# Patient Record
Sex: Female | Born: 1985 | Race: White | Hispanic: No | Marital: Married | Smoking: Former smoker
Health system: Southern US, Community
[De-identification: ages and names within clinical notes are randomized; demographics above are authoritative.]

## PROBLEM LIST (undated history)

## (undated) ENCOUNTER — Inpatient Hospital Stay (HOSPITAL_COMMUNITY): Payer: Self-pay

## (undated) DIAGNOSIS — I10 Essential (primary) hypertension: Secondary | ICD-10-CM

## (undated) HISTORY — PX: WISDOM TOOTH EXTRACTION: SHX21

## (undated) HISTORY — DX: Essential (primary) hypertension: I10

---
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
## 2007-01-25 ENCOUNTER — Ambulatory Visit (HOSPITAL_COMMUNITY): Admission: RE | Admit: 2007-01-25 | Discharge: 2007-01-25 | Payer: Self-pay | Admitting: Obstetrics and Gynecology

## 2007-01-25 ENCOUNTER — Ambulatory Visit: Payer: Self-pay | Admitting: Obstetrics & Gynecology

## 2007-01-25 ENCOUNTER — Encounter: Payer: Self-pay | Admitting: Obstetrics and Gynecology

## 2007-01-27 ENCOUNTER — Ambulatory Visit: Payer: Self-pay | Admitting: Obstetrics & Gynecology

## 2007-01-30 ENCOUNTER — Ambulatory Visit: Payer: Self-pay | Admitting: Obstetrics & Gynecology

## 2007-02-02 ENCOUNTER — Ambulatory Visit: Payer: Self-pay | Admitting: Obstetrics & Gynecology

## 2007-02-06 ENCOUNTER — Ambulatory Visit: Payer: Self-pay | Admitting: Gynecology

## 2007-02-09 ENCOUNTER — Ambulatory Visit: Payer: Self-pay | Admitting: Family Medicine

## 2007-02-13 ENCOUNTER — Ambulatory Visit: Payer: Self-pay | Admitting: Obstetrics & Gynecology

## 2007-02-16 ENCOUNTER — Ambulatory Visit: Payer: Self-pay | Admitting: Family Medicine

## 2007-02-17 ENCOUNTER — Ambulatory Visit: Payer: Self-pay | Admitting: Obstetrics and Gynecology

## 2007-02-20 ENCOUNTER — Inpatient Hospital Stay (HOSPITAL_COMMUNITY): Admission: AD | Admit: 2007-02-20 | Discharge: 2007-02-22 | Payer: Self-pay | Admitting: Obstetrics and Gynecology

## 2007-02-20 ENCOUNTER — Ambulatory Visit: Payer: Self-pay | Admitting: Physician Assistant

## 2007-04-05 ENCOUNTER — Ambulatory Visit: Payer: Self-pay | Admitting: Obstetrics and Gynecology

## 2007-04-05 ENCOUNTER — Encounter: Payer: Self-pay | Admitting: Obstetrics and Gynecology

## 2007-06-15 ENCOUNTER — Ambulatory Visit: Payer: Self-pay | Admitting: Obstetrics and Gynecology

## 2010-10-06 NOTE — Group Therapy Note (Signed)
NAME:  Wendy Walter, Wendy Walter NO.:  [id number]   MEDICAL RECORD NO.:  [id number]          PATIENT TYPE:  WOC   LOCATION:  [facility]                   FACILITY:  [facility]   PHYSICIAN:  Argentina Donovan, MD        DATE OF BIRTH:  Jul 08, 1985   DATE OF SERVICE:  04/05/2007                                  CLINIC NOTE   The patient is a 25 year old Caucasian female 6 weeks postpartum for a  normal spontaneous delivery with only chronic hypertension during her  prenatal course for which she was in the high-risk clinic.  Her blood  pressure today on no medication is normal at 125/80, she weighs 249  pounds.  Postpartum she has been doing very well, she has no complaints.  She is nursing and for birth control desired a Mirena.  We have fully  explained the possible complications and the advantages of the Mirena.  She has read the information and signed that and Pap smear was taken.  The uterus was of normal size, shape and consistency.  Anterior  __________ adnexa could not be well palpated.  External genitalia is  normal, BUS within normal limits.  Vagina is clean and well rugated.  Cervix is clean, parous.  The uterus sounded to a depth of 8 sonometers.  After insertion the cervix bled somewhat from the tenaculum marks and a  ring forceps was applied for 5 minutes for control of that.  Otherwise  the patient did well and was discharged.  CBC will be taken and the  patient is instructed to come back if heavy bleeding ensues.           ______________________________  Argentina Donovan, MD     PR/MEDQ  D:  04/05/2007  T:  04/06/2007  Job:  [id number]

## 2011-03-04 LAB — CBC
HCT: 32 — ABNORMAL LOW
Hemoglobin: 10.6 — ABNORMAL LOW
Hemoglobin: 11.5 — ABNORMAL LOW
MCHC: 34
MCV: 78.4
RBC: 4.33
RDW: 15.6 — ABNORMAL HIGH

## 2011-03-04 LAB — URINALYSIS, ROUTINE W REFLEX MICROSCOPIC
Ketones, ur: NEGATIVE
Leukocytes, UA: NEGATIVE
Nitrite: NEGATIVE
Protein, ur: NEGATIVE
Urobilinogen, UA: 0.2

## 2011-03-04 LAB — POCT URINALYSIS DIP (DEVICE)
Nitrite: NEGATIVE
Operator id: 148111
Protein, ur: 30 — AB
Urobilinogen, UA: 0.2

## 2011-03-04 LAB — WOUND CULTURE
Culture: NO GROWTH
Gram Stain: NONE SEEN

## 2011-03-04 LAB — CCBB MATERNAL DONOR DRAW

## 2011-03-04 LAB — RAPID URINE DRUG SCREEN, HOSP PERFORMED: Cocaine: NOT DETECTED

## 2011-03-04 LAB — URINE MICROSCOPIC-ADD ON

## 2013-05-24 NOTE — L&D Delivery Note (Signed)
Delivery Note At 6:59 PM a viable female was delivered via Vaginal, Spontaneous Delivery (Presentation: Left Occiput Anterior).  APGAR: 8, 9; weight pending.   Placenta status: Intact, Spontaneous.  Cord: 3 vessels with the following complications: None.  Cord pH: n/a  Anesthesia: Epidural  Episiotomy: None Lacerations: None Suture Repair: None Est. Blood Loss (mL):  200  Mom to postpartum.  Baby to Couplet care / Skin to Skin.  Wendy Walter, Wendy Walter 05/06/2014, 7:17 PM

## 2013-10-16 ENCOUNTER — Encounter: Payer: Self-pay | Admitting: Family Medicine

## 2013-10-16 ENCOUNTER — Ambulatory Visit (INDEPENDENT_AMBULATORY_CARE_PROVIDER_SITE_OTHER): Payer: BC Managed Care – PPO | Admitting: Family Medicine

## 2013-10-16 VITALS — BP 133/98 | HR 126 | Wt 251.0 lb

## 2013-10-16 DIAGNOSIS — O0990 Supervision of high risk pregnancy, unspecified, unspecified trimester: Secondary | ICD-10-CM | POA: Insufficient documentation

## 2013-10-16 DIAGNOSIS — Z124 Encounter for screening for malignant neoplasm of cervix: Secondary | ICD-10-CM

## 2013-10-16 DIAGNOSIS — E669 Obesity, unspecified: Secondary | ICD-10-CM

## 2013-10-16 DIAGNOSIS — O09299 Supervision of pregnancy with other poor reproductive or obstetric history, unspecified trimester: Secondary | ICD-10-CM

## 2013-10-16 DIAGNOSIS — Z3480 Encounter for supervision of other normal pregnancy, unspecified trimester: Secondary | ICD-10-CM

## 2013-10-16 DIAGNOSIS — O10019 Pre-existing essential hypertension complicating pregnancy, unspecified trimester: Secondary | ICD-10-CM

## 2013-10-16 DIAGNOSIS — Z113 Encounter for screening for infections with a predominantly sexual mode of transmission: Secondary | ICD-10-CM

## 2013-10-16 DIAGNOSIS — O99210 Obesity complicating pregnancy, unspecified trimester: Secondary | ICD-10-CM

## 2013-10-16 MED ORDER — ASPIRIN 81 MG PO CHEW: 81.0000 mg | CHEWABLE_TABLET | Freq: Every day | ORAL | Status: DC

## 2013-10-16 MED ORDER — PRENATAL VITAMINS 0.8 MG PO TABS: 1.0000 | ORAL_TABLET | Freq: Every day | ORAL | Status: DC

## 2013-10-16 NOTE — Progress Notes (Signed)
   Subjective:    Wendy Walter is a [id number] [redacted]w[redacted]d being seen today for her first obstetrical visit.  Her obstetrical history is significant for obesity and pre-eclampsia. She has long h/o of high anxiety with MD visits and elevated BP.  Induced at 37 wks last gestation for pre-eclampsia.  Has had baseline testing, etc.  BP is up here today. Patient does intend to breast feed. Pregnancy history fully reviewed. Was weaning 28 year old and taking POP's when got pregnant.  LMP is unknown.(end of April)  Patient reports no complaints.  Filed Vitals:   10/16/13 1504  BP: 152/92  Pulse: 126  Weight: 251 lb (113.853 kg)    HISTORY: OB History  Gravida Para Term Preterm AB SAB TAB Ectopic Multiple Living  3 2 2       2     # Outcome Date GA Lbr Len/2nd Weight Sex Delivery Anes PTL Lv  3 CUR           2 TRM 11/14/12 [redacted]w[redacted]d  8 lb 10 oz (3.912 kg) M SVD EPI N Y     Comments: elevated BP  1 TRM 02/20/07 [redacted]w[redacted]d  7 lb 6 oz (3.345 kg) M SVD EPI N Y     Comments: elevated BP     Past Medical History  Diagnosis Date  . Hypertension    Past Surgical History  Procedure Laterality Date  . Wisdom tooth extraction     Family History  Problem Relation Age of Onset  . Diabetes Father   . Hypertension Maternal Grandmother   . Hypertension Paternal Grandmother      Exam    Uterus:   8 wk size  Pelvic Exam:    Perineum: Normal Perineum   Vulva: Bartholin's, Urethra, Skene's normal   Vagina:  normal mucosa, normal discharge   Cervix: no cervical motion tenderness and no lesions   Adnexa: normal adnexa   Bony Pelvis: average  System: Breast:  normal appearance, no masses or tenderness   Skin: normal coloration and turgor, no rashes    Neurologic: normal   Extremities: normal strength, tone, and muscle mass   HEENT sclera clear, anicteric   Mouth/Teeth mucous membranes moist, pharynx normal without lesions and dental hygiene good   Neck supple   Cardiovascular: regular rate and rhythm, no  murmurs or gallops   Respiratory:  appears well, vitals normal, no respiratory distress, acyanotic, normal RR, ear and throat exam is normal, neck free of mass or lymphadenopathy, chest clear, no wheezing, crepitations, rhonchi, normal symmetric air entry   Abdomen: soft, non-tender; bowel sounds normal; no masses,  no organomegaly      Assessment:    Pregnancy: [id number] Patient Active Problem List   Diagnosis Date Noted  . Supervision of other normal pregnancy 10/16/2013  . History of pre-eclampsia in prior pregnancy, currently pregnant 10/16/2013        Plan:     Initial labs ordered with baseline HTN labs and TSH and 24 hour urine Baby ASA after 1st trimester Prenatal vitamins. Problem list reviewed and updated. Genetic Screening discussed First Screen: declined.  Ultrasound discussed; fetal survey: discussed.  Follow up in 4 weeks.    Reva Bores 10/16/2013

## 2013-10-16 NOTE — Patient Instructions (Signed)
Pregnancy - First Trimester During sexual intercourse, millions of sperm go into the vagina. Only 1 sperm will penetrate and fertilize the female egg while it is in the Fallopian tube. One week later, the fertilized egg implants into the wall of the uterus. An embryo begins to develop into a baby. At 6 to 8 weeks, the eyes and face are formed and the heartbeat can be seen on ultrasound. At the end of 12 weeks (first trimester), all the baby's organs are formed. Now that you are pregnant, you will want to do everything you can to have a healthy baby. Two of the most important things are to get good prenatal care and follow your caregiver's instructions. Prenatal care is all the medical care you receive before the baby's birth. It is given to prevent, find, and treat problems during the pregnancy and childbirth. PRENATAL EXAMS  During prenatal visits, your weight, blood pressure, and urine are checked. This is done to make sure you are healthy and progressing normally during the pregnancy.  A pregnant woman should gain 25 to 35 pounds during the pregnancy. However, if you are overweight or underweight, your caregiver will advise you regarding your weight.  Your caregiver will ask and answer questions for you.  Blood work, cervical cultures, other necessary tests, and a Pap test are done during your prenatal exams. These tests are done to check on your health and the probable health of your baby. Tests are strongly recommended and done for HIV with your permission. This is the virus that causes AIDS. These tests are done because medicines can be given to help prevent your baby from being born with this infection should you have been infected without knowing it. Blood work is also used to find out your blood type, previous infections, and follow your blood levels (hemoglobin).  Low hemoglobin (anemia) is common during pregnancy. Iron and vitamins are given to help prevent this. Later in the pregnancy,  blood tests for diabetes will be done along with any other tests if any problems develop.  You may need other tests to make sure you and the baby are doing well. CHANGES DURING THE FIRST TRIMESTER  Your body goes through many changes during pregnancy. They vary from person to person. Talk to your caregiver about changes you notice and are concerned about. Changes can include:  Your menstrual period stops.  The egg and sperm carry the genes that determine what you look like. Genes from you and your partner are forming a baby. The female genes determine whether the baby is a boy or a girl.  Your body increases in girth and you may feel bloated.  Feeling sick to your stomach (nauseous) and throwing up (vomiting). If the vomiting is uncontrollable, call your caregiver.  Your breasts will begin to enlarge and become tender.  Your nipples may stick out more and become darker.  The need to urinate more. Painful urination may mean you have a bladder infection.  Tiring easily.  Loss of appetite.  Cravings for certain kinds of food.  At first, you may gain or lose a couple of pounds.  You may have changes in your emotions from day to day (excited to be pregnant or concerned something may go wrong with the pregnancy and baby).  You may have more vivid and strange dreams. HOME CARE INSTRUCTIONS   It is very important to avoid all smoking, alcohol and non-prescribed drugs during your pregnancy. These affect the formation and growth of the baby.   Avoid chemicals while pregnant to ensure the delivery of a healthy infant.  Start your prenatal visits by the 12th week of pregnancy. They are usually scheduled monthly at first, then more often in the last 2 months before delivery. Keep your caregiver's appointments. Follow your caregiver's instructions regarding medicine use, blood and lab tests, exercise, and diet.  During pregnancy, you are providing food for you and your baby. Eat regular,  well-balanced meals. Choose foods such as meat, fish, milk and other low fat dairy products, vegetables, fruits, and whole-grain breads and cereals. Your caregiver will tell you of the ideal weight gain.  You can help morning sickness by keeping soda crackers at the bedside. Eat a couple before arising in the morning. You may want to use the crackers without salt on them.  Eating 4 to 5 small meals rather than 3 large meals a day also may help the nausea and vomiting.  Drinking liquids between meals instead of during meals also seems to help nausea and vomiting.  A physical sexual relationship may be continued throughout pregnancy if there are no other problems. Problems may be early (premature) leaking of amniotic fluid from the membranes, vaginal bleeding, or belly (abdominal) pain.  Exercise regularly if there are no restrictions. Check with your caregiver or physical therapist if you are unsure of the safety of some of your exercises. Greater weight gain will occur in the last 2 trimesters of pregnancy. Exercising will help:  Control your weight.  Keep you in shape.  Prepare you for labor and delivery.  Help you lose your pregnancy weight after you deliver your baby.  Wear a good support or jogging bra for breast tenderness during pregnancy. This may help if worn during sleep too.  Ask when prenatal classes are available. Begin classes when they are offered.  Do not use hot tubs, steam rooms, or saunas.  Wear your seat belt when driving. This protects you and your baby if you are in an accident.  Avoid raw meat, uncooked cheese, cat litter boxes, and soil used by cats throughout the pregnancy. These carry germs that can cause birth defects in the baby.  The first trimester is a good time to visit your dentist for your dental health. Getting your teeth cleaned is okay. Use a softer toothbrush and brush gently during pregnancy.  Ask for help if you have financial, counseling, or  nutritional needs during pregnancy. Your caregiver will be able to offer counseling for these needs as well as refer you for other special needs.  Do not take any medicines or herbs unless told by your caregiver.  Inform your caregiver if there is any mental or physical domestic violence.  Make a list of emergency phone numbers of family, friends, hospital, and police and fire departments.  Write down your questions. Take them to your prenatal visit.  Do not douche.  Do not cross your legs.  If you have to stand for long periods of time, rotate you feet or take small steps in a circle.  You may have more vaginal secretions that may require a sanitary pad. Do not use tampons or scented sanitary pads. MEDICINES AND DRUG USE IN PREGNANCY  Take prenatal vitamins as directed. The vitamin should contain 1 milligram of folic acid. Keep all vitamins out of reach of children. Only a couple vitamins or tablets containing iron may be fatal to a baby or young child when ingested.  Avoid use of all medicines, including herbs, over-the-counter medicines, not   prescribed or suggested by your caregiver. Only take over-the-counter or prescription medicines for pain, discomfort, or fever as directed by your caregiver. Do not use aspirin, ibuprofen, or naproxen unless directed by your caregiver.  Let your caregiver also know about herbs you may be using.  Alcohol is related to a number of birth defects. This includes fetal alcohol syndrome. All alcohol, in any form, should be avoided completely. Smoking will cause low birth rate and premature babies.  Street or illegal drugs are very harmful to the baby. They are absolutely forbidden. A baby born to an addicted mother will be addicted at birth. The baby will go through the same withdrawal an adult does.  Let your caregiver know about any medicines that you have to take and for what reason you take them. SEEK MEDICAL CARE IF:  You have any concerns or  worries during your pregnancy. It is better to call with your questions if you feel they cannot wait, rather than worry about them. SEEK IMMEDIATE MEDICAL CARE IF:   An unexplained oral temperature above 102 F (38.9 C) develops, or as your caregiver suggests.  You have leaking of fluid from the vagina (birth canal). If leaking membranes are suspected, take your temperature and inform your caregiver of this when you call.  There is vaginal spotting or bleeding. Notify your caregiver of the amount and how many pads are used.  You develop a bad smelling vaginal discharge with a change in the color.  You continue to feel sick to your stomach (nauseated) and have no relief from remedies suggested. You vomit blood or coffee ground-like materials.  You lose more than 2 pounds of weight in 1 week.  You gain more than 2 pounds of weight in 1 week and you notice swelling of your face, hands, feet, or legs.  You gain 5 pounds or more in 1 week (even if you do not have swelling of your hands, face, legs, or feet).  You get exposed to German measles and have never had them.  You are exposed to fifth disease or chickenpox.  You develop belly (abdominal) pain. Round ligament discomfort is a common non-cancerous (benign) cause of abdominal pain in pregnancy. Your caregiver still must evaluate this.  You develop headache, fever, diarrhea, pain with urination, or shortness of breath.  You fall or are in a car accident or have any kind of trauma.  There is mental or physical violence in your home. Document Released: 05/04/2001 Document Revised: 02/02/2012 Document Reviewed: 11/05/2008 [employer] Patient Information 2014 [employer].  Breastfeeding Deciding to breastfeed is one of the best choices you can make for you and your baby. A change in hormones during pregnancy causes your breast tissue to grow and increases the number and size of your milk ducts. These hormones also allow proteins,  sugars, and fats from your blood supply to make breast milk in your milk-producing glands. Hormones prevent breast milk from being released before your baby is born as well as prompt milk flow after birth. Once breastfeeding has begun, thoughts of your baby, as well as his or her sucking or crying, can stimulate the release of milk from your milk-producing glands.  BENEFITS OF BREASTFEEDING For Your Baby  Your first milk (colostrum) helps your baby's digestive system function better.   There are antibodies in your milk that help your baby fight off infections.   Your baby has a lower incidence of asthma, allergies, and sudden infant death syndrome.   The nutrients   in breast milk are better for your baby than infant formulas and are designed uniquely for your baby's needs.   Breast milk improves your baby's brain development.   Your baby is less likely to develop other conditions, such as childhood obesity, asthma, or type 2 diabetes mellitus.  For You   Breastfeeding helps to create a very special bond between you and your baby.   Breastfeeding is convenient. Breast milk is always available at the correct temperature and costs nothing.   Breastfeeding helps to burn calories and helps you lose the weight gained during pregnancy.   Breastfeeding makes your uterus contract to its prepregnancy size faster and slows bleeding (lochia) after you give birth.   Breastfeeding helps to lower your risk of developing type 2 diabetes mellitus, osteoporosis, and breast or ovarian cancer later in life. SIGNS THAT YOUR BABY IS HUNGRY Early Signs of Hunger  Increased alertness or activity.  Stretching.  Movement of the head from side to side.  Movement of the head and opening of the mouth when the corner of the mouth or cheek is stroked (rooting).  Increased sucking sounds, smacking lips, cooing, sighing, or squeaking.  Hand-to-mouth movements.  Increased sucking of fingers or  hands. Late Signs of Hunger  Fussing.  Intermittent crying. Extreme Signs of Hunger Signs of extreme hunger will require calming and consoling before your baby will be able to breastfeed successfully. Do not wait for the following signs of extreme hunger to occur before you initiate breastfeeding:   Restlessness.  A loud, strong cry.   Screaming. BREASTFEEDING BASICS Breastfeeding Initiation  Find a comfortable place to sit or lie down, with your neck and back well supported.  Place a pillow or rolled up blanket under your baby to bring him or her to the level of your breast (if you are seated). Nursing pillows are specially designed to help support your arms and your baby while you breastfeed.  Make sure that your baby's abdomen is facing your abdomen.   Gently massage your breast. With your fingertips, massage from your chest wall toward your nipple in a circular motion. This encourages milk flow. You may need to continue this action during the feeding if your milk flows slowly.  Support your breast with 4 fingers underneath and your thumb above your nipple. Make sure your fingers are well away from your nipple and your baby's mouth.   Stroke your baby's lips gently with your finger or nipple.   When your baby's mouth is open wide enough, quickly bring your baby to your breast, placing your entire nipple and as much of the colored area around your nipple (areola) as possible into your baby's mouth.   More areola should be visible above your baby's upper lip than below the lower lip.   Your baby's tongue should be between his or her lower gum and your breast.   Ensure that your baby's mouth is correctly positioned around your nipple (latched). Your baby's lips should create a seal on your breast and be turned out (everted).  It is common for your baby to suck about 2 3 minutes in order to start the flow of breast milk. Latching Teaching your baby how to latch on to your  breast properly is very important. An improper latch can cause nipple pain and decreased milk supply for you and poor weight gain in your baby. Also, if your baby is not latched onto your nipple properly, he or she may swallow some air during   feeding. This can make your baby fussy. Burping your baby when you switch breasts during the feeding can help to get rid of the air. However, teaching your baby to latch on properly is still the best way to prevent fussiness from swallowing air while breastfeeding. Signs that your baby has successfully latched on to your nipple:    Silent tugging or silent sucking, without causing you pain.   Swallowing heard between every 3 4 sucks.    Muscle movement above and in front of his or her ears while sucking.  Signs that your baby has not successfully latched on to nipple:   Sucking sounds or smacking sounds from your baby while breastfeeding.  Nipple pain. If you think your baby has not latched on correctly, slip your finger into the corner of your baby's mouth to break the suction and place it between your baby's gums. Attempt breastfeeding initiation again. Signs of Successful Breastfeeding Signs from your baby:   A gradual decrease in the number of sucks or complete cessation of sucking.   Falling asleep.   Relaxation of his or her body.   Retention of a small amount of milk in his or her mouth.   Letting go of your breast by himself or herself. Signs from you:  Breasts that have increased in firmness, weight, and size 1 3 hours after feeding.   Breasts that are softer immediately after breastfeeding.  Increased milk volume, as well as a change in milk consistency and color by the 5th day of breastfeeding.   Nipples that are not sore, cracked, or bleeding. Signs That Your Baby is Getting Enough Milk  Wetting at least 3 diapers in a 24-hour period. The urine should be clear and pale yellow by age 5 days.  At least 3 stools in a  24-hour period by age 5 days. The stool should be soft and yellow.  At least 3 stools in a 24-hour period by age 7 days. The stool should be seedy and yellow.  No loss of weight greater than 10% of birth weight during the first 3 days of age.  Average weight gain of 4 7 ounces (120 210 mL) per week after age 4 days.  Consistent daily weight gain by age 5 days, without weight loss after the age of 2 weeks. After a feeding, your baby may spit up a small amount. This is common. BREASTFEEDING FREQUENCY AND DURATION Frequent feeding will help you make more milk and can prevent sore nipples and breast engorgement. Breastfeed when you feel the need to reduce the fullness of your breasts or when your baby shows signs of hunger. This is called "breastfeeding on demand." Avoid introducing a pacifier to your baby while you are working to establish breastfeeding (the first 4 6 weeks after your baby is born). After this time you may choose to use a pacifier. Research has shown that pacifier use during the first year of a baby's life decreases the risk of sudden infant death syndrome (SIDS). Allow your baby to feed on each breast as long as he or she wants. Breastfeed until your baby is finished feeding. When your baby unlatches or falls asleep while feeding from the first breast, offer the second breast. Because newborns are often sleepy in the first few weeks of life, you may need to awaken your baby to get him or her to feed. Breastfeeding times will vary from baby to baby. However, the following rules can serve as a guide to help you   ensure that your baby is properly fed:  Newborns (babies 4 weeks of age or younger) may breastfeed every 1 3 hours.  Newborns should not go longer than 3 hours during the day or 5 hours during the night without breastfeeding.  You should breastfeed your baby a minimum of 8 times in a 24-hour period until you begin to introduce solid foods to your baby at around 6 months of  age. BREAST MILK PUMPING Pumping and storing breast milk allows you to ensure that your baby is exclusively fed your breast milk, even at times when you are unable to breastfeed. This is especially important if you are going back to work while you are still breastfeeding or when you are not able to be present during feedings. Your lactation consultant can give you guidelines on how long it is safe to store breast milk.  A breast pump is a machine that allows you to pump milk from your breast into a sterile bottle. The pumped breast milk can then be stored in a refrigerator or freezer. Some breast pumps are operated by hand, while others use electricity. Ask your lactation consultant which type will work best for you. Breast pumps can be purchased, but some hospitals and breastfeeding support groups lease breast pumps on a monthly basis. A lactation consultant can teach you how to hand express breast milk, if you prefer not to use a pump.  CARING FOR YOUR BREASTS WHILE YOU BREASTFEED Nipples can become dry, cracked, and sore while breastfeeding. The following recommendations can help keep your breasts moisturized and healthy:  Avoid using soap on your nipples.   Wear a supportive bra. Although not required, special nursing bras and tank tops are designed to allow access to your breasts for breastfeeding without taking off your entire bra or top. Avoid wearing underwire style bras or extremely tight bras.  Air dry your nipples for 3 4minutes after each feeding.   Use only cotton bra pads to absorb leaked breast milk. Leaking of breast milk between feedings is normal.   Use lanolin on your nipples after breastfeeding. Lanolin helps to maintain your skin's normal moisture barrier. If you use pure lanolin you do not need to wash it off before feeding your baby again. Pure lanolin is not toxic to your baby. You may also hand express a few drops of breast milk and gently massage that milk into your  nipples and allow the milk to air dry. In the first few weeks after giving birth, some women experience extremely full breasts (engorgement). Engorgement can make your breasts feel heavy, warm, and tender to the touch. Engorgement peaks within 3 5 days after you give birth. The following recommendations can help ease engorgement:  Completely empty your breasts while breastfeeding or pumping. You may want to start by applying warm, moist heat (in the shower or with warm water-soaked hand towels) just before feeding or pumping. This increases circulation and helps the milk flow. If your baby does not completely empty your breasts while breastfeeding, pump any extra milk after he or she is finished.  Wear a snug bra (nursing or regular) or tank top for 1 2 days to signal your body to slightly decrease milk production.  Apply ice packs to your breasts, unless this is too uncomfortable for you.  Make sure that your baby is latched on and positioned properly while breastfeeding. If engorgement persists after 48 hours of following these recommendations, contact your health care provider or a lactation consultant. OVERALL   HEALTH CARE RECOMMENDATIONS WHILE BREASTFEEDING  Eat healthy foods. Alternate between meals and snacks, eating 3 of each per day. Because what you eat affects your breast milk, some of the foods may make your baby more irritable than usual. Avoid eating these foods if you are sure that they are negatively affecting your baby.  Drink milk, fruit juice, and water to satisfy your thirst (about 10 glasses a day).   Rest often, relax, and continue to take your prenatal vitamins to prevent fatigue, stress, and anemia.  Continue breast self-awareness checks.  Avoid chewing and smoking tobacco.  Avoid alcohol and drug use. Some medicines that may be harmful to your baby can pass through breast milk. It is important to ask your health care provider before taking any medicine, including all  over-the-counter and prescription medicine as well as vitamin and herbal supplements. It is possible to become pregnant while breastfeeding. If birth control is desired, ask your health care provider about options that will be safe for your baby. SEEK MEDICAL CARE IF:   You feel like you want to stop breastfeeding or have become frustrated with breastfeeding.  You have painful breasts or nipples.  Your nipples are cracked or bleeding.  Your breasts are red, tender, or warm.  You have a swollen area on either breast.  You have a fever or chills.  You have nausea or vomiting.  You have drainage other than breast milk from your nipples.  Your breasts do not become full before feedings by the 5th day after you give birth.  You feel sad and depressed.  Your baby is too sleepy to eat well.  Your baby is having trouble sleeping.   Your baby is wetting less than 3 diapers in a 24-hour period.  Your baby has less than 3 stools in a 24-hour period.  Your baby's skin or the white part of his or her eyes becomes yellow.   Your baby is not gaining weight by 5 days of age. SEEK IMMEDIATE MEDICAL CARE IF:   Your baby is overly tired (lethargic) and does not want to wake up and feed.  Your baby develops an unexplained fever. Document Released: 05/10/2005 Document Revised: 01/10/2013 Document Reviewed: 11/01/2012 [employer] Patient Information 2014 [employer].  

## 2013-10-17 LAB — CULTURE, OB URINE: Colony Count: 40000

## 2013-10-22 ENCOUNTER — Other Ambulatory Visit (INDEPENDENT_AMBULATORY_CARE_PROVIDER_SITE_OTHER): Payer: BC Managed Care – PPO | Admitting: *Deleted

## 2013-10-22 DIAGNOSIS — O09299 Supervision of pregnancy with other poor reproductive or obstetric history, unspecified trimester: Secondary | ICD-10-CM

## 2013-10-22 LAB — CBC
HCT: 36.6 % (ref 36.0–46.0)
Hemoglobin: 12.3 g/dL (ref 12.0–15.0)
MCH: 28.1 pg (ref 26.0–34.0)
MCHC: 33.6 g/dL (ref 30.0–36.0)
MCV: 83.6 fL (ref 78.0–100.0)
PLATELETS: 239 10*3/uL (ref 150–400)
RBC: 4.38 MIL/uL (ref 3.87–5.11)
RDW: 14 % (ref 11.5–15.5)
WBC: 8.8 10*3/uL (ref 4.0–10.5)

## 2013-10-23 ENCOUNTER — Encounter: Payer: Self-pay | Admitting: Family Medicine

## 2013-10-23 DIAGNOSIS — O9989 Other specified diseases and conditions complicating pregnancy, childbirth and the puerperium: Secondary | ICD-10-CM

## 2013-10-23 DIAGNOSIS — Z283 Underimmunization status: Secondary | ICD-10-CM | POA: Insufficient documentation

## 2013-10-23 DIAGNOSIS — Z2839 Other underimmunization status: Secondary | ICD-10-CM | POA: Insufficient documentation

## 2013-10-23 LAB — OBSTETRIC PANEL
Antibody Screen: NEGATIVE
Basophils Absolute: 0 10*3/uL (ref 0.0–0.1)
Basophils Relative: 0 % (ref 0–1)
Eosinophils Absolute: 0.1 10*3/uL (ref 0.0–0.7)
Eosinophils Relative: 1 % (ref 0–5)
HCT: 36.6 % (ref 36.0–46.0)
Hemoglobin: 12.3 g/dL (ref 12.0–15.0)
Hepatitis B Surface Ag: NEGATIVE
Lymphocytes Relative: 21 % (ref 12–46)
Lymphs Abs: 1.8 10*3/uL (ref 0.7–4.0)
MCH: 28.1 pg (ref 26.0–34.0)
MCHC: 33.6 g/dL (ref 30.0–36.0)
MCV: 83.6 fL (ref 78.0–100.0)
Monocytes Absolute: 0.5 10*3/uL (ref 0.1–1.0)
Monocytes Relative: 6 % (ref 3–12)
Neutro Abs: 6.3 10*3/uL (ref 1.7–7.7)
Neutrophils Relative %: 72 % (ref 43–77)
Platelets: 239 10*3/uL (ref 150–400)
RBC: 4.38 MIL/uL (ref 3.87–5.11)
RDW: 14 % (ref 11.5–15.5)
Rh Type: POSITIVE
Rubella: 0.92 Index — ABNORMAL HIGH (ref <0.90)
WBC: 8.8 10*3/uL (ref 4.0–10.5)

## 2013-10-23 LAB — COMPREHENSIVE METABOLIC PANEL
ALT: 14 U/L (ref 0–35)
AST: 12 U/L (ref 0–37)
Albumin: 3.6 g/dL (ref 3.5–5.2)
Alkaline Phosphatase: 42 U/L (ref 39–117)
BUN: 7 mg/dL (ref 6–23)
CO2: 21 mEq/L (ref 19–32)
Calcium: 9.1 mg/dL (ref 8.4–10.5)
Chloride: 105 mEq/L (ref 96–112)
Creat: 0.58 mg/dL (ref 0.50–1.10)
Glucose, Bld: 99 mg/dL (ref 70–99)
Potassium: 4.1 mEq/L (ref 3.5–5.3)
Sodium: 138 mEq/L (ref 135–145)
TOTAL PROTEIN: 6 g/dL (ref 6.0–8.3)
Total Bilirubin: 0.3 mg/dL (ref 0.2–1.2)

## 2013-10-23 LAB — GLUCOSE TOLERANCE, 1 HOUR (50G) W/O FASTING: Glucose, 1 Hour GTT: 109 mg/dL (ref 70–140)

## 2013-10-23 LAB — PROTEIN, URINE, 24 HOUR
Protein, 24H Urine: 53 mg/d (ref 50–100)
Protein, Urine: 3 mg/dL

## 2013-10-23 LAB — HIV ANTIBODY (ROUTINE TESTING W REFLEX): HIV: NONREACTIVE

## 2013-10-23 LAB — TSH: TSH: 1.613 u[IU]/mL (ref 0.350–4.500)

## 2013-11-12 ENCOUNTER — Ambulatory Visit (INDEPENDENT_AMBULATORY_CARE_PROVIDER_SITE_OTHER): Payer: BC Managed Care – PPO | Admitting: Obstetrics and Gynecology

## 2013-11-12 ENCOUNTER — Encounter: Payer: Self-pay | Admitting: Obstetrics and Gynecology

## 2013-11-12 VITALS — BP 134/90 | HR 106 | Ht 67.0 in | Wt 258.0 lb

## 2013-11-12 DIAGNOSIS — Z283 Underimmunization status: Secondary | ICD-10-CM

## 2013-11-12 DIAGNOSIS — O09291 Supervision of pregnancy with other poor reproductive or obstetric history, first trimester: Secondary | ICD-10-CM

## 2013-11-12 DIAGNOSIS — O99211 Obesity complicating pregnancy, first trimester: Secondary | ICD-10-CM

## 2013-11-12 DIAGNOSIS — Z3490 Encounter for supervision of normal pregnancy, unspecified, unspecified trimester: Secondary | ICD-10-CM

## 2013-11-12 DIAGNOSIS — Z3481 Encounter for supervision of other normal pregnancy, first trimester: Secondary | ICD-10-CM

## 2013-11-12 DIAGNOSIS — O99210 Obesity complicating pregnancy, unspecified trimester: Secondary | ICD-10-CM

## 2013-11-12 DIAGNOSIS — O9989 Other specified diseases and conditions complicating pregnancy, childbirth and the puerperium: Secondary | ICD-10-CM

## 2013-11-12 DIAGNOSIS — E669 Obesity, unspecified: Secondary | ICD-10-CM

## 2013-11-12 DIAGNOSIS — Z3480 Encounter for supervision of other normal pregnancy, unspecified trimester: Secondary | ICD-10-CM

## 2013-11-12 DIAGNOSIS — O10019 Pre-existing essential hypertension complicating pregnancy, unspecified trimester: Secondary | ICD-10-CM

## 2013-11-12 DIAGNOSIS — O09899 Supervision of other high risk pregnancies, unspecified trimester: Secondary | ICD-10-CM

## 2013-11-12 DIAGNOSIS — Z2839 Other underimmunization status: Secondary | ICD-10-CM

## 2013-11-12 DIAGNOSIS — O09299 Supervision of pregnancy with other poor reproductive or obstetric history, unspecified trimester: Secondary | ICD-10-CM

## 2013-11-12 NOTE — Progress Notes (Signed)
Patient is doing well

## 2013-11-12 NOTE — Progress Notes (Signed)
Patient is doing well without complaints. Not interested in first trimester screen. Nausea/emesis resolved

## 2013-12-07 ENCOUNTER — Ambulatory Visit (INDEPENDENT_AMBULATORY_CARE_PROVIDER_SITE_OTHER): Payer: BC Managed Care – PPO | Admitting: Family Medicine

## 2013-12-07 ENCOUNTER — Encounter: Payer: Self-pay | Admitting: Family Medicine

## 2013-12-07 VITALS — BP 138/85 | HR 114 | Wt 258.8 lb

## 2013-12-07 DIAGNOSIS — O99210 Obesity complicating pregnancy, unspecified trimester: Secondary | ICD-10-CM

## 2013-12-07 DIAGNOSIS — O99212 Obesity complicating pregnancy, second trimester: Secondary | ICD-10-CM

## 2013-12-07 DIAGNOSIS — O09292 Supervision of pregnancy with other poor reproductive or obstetric history, second trimester: Secondary | ICD-10-CM

## 2013-12-07 DIAGNOSIS — E669 Obesity, unspecified: Secondary | ICD-10-CM

## 2013-12-07 DIAGNOSIS — O09299 Supervision of pregnancy with other poor reproductive or obstetric history, unspecified trimester: Secondary | ICD-10-CM

## 2013-12-07 DIAGNOSIS — Z3480 Encounter for supervision of other normal pregnancy, unspecified trimester: Secondary | ICD-10-CM

## 2013-12-07 NOTE — Patient Instructions (Signed)
Second Trimester of Pregnancy The second trimester is from week 13 through week 28, months 4 through 6. The second trimester is often a time when you feel your best. Your body has also adjusted to being pregnant, and you begin to feel better physically. Usually, morning sickness has lessened or quit completely, you may have more energy, and you may have an increase in appetite. The second trimester is also a time when the fetus is growing rapidly. At the end of the sixth month, the fetus is about 9 inches long and weighs about 1 pounds. You will likely begin to feel the baby move (quickening) between 18 and 20 weeks of the pregnancy. BODY CHANGES Your body goes through many changes during pregnancy. The changes vary from woman to woman.   Your weight will continue to increase. You will notice your lower abdomen bulging out.  You may begin to get stretch marks on your hips, abdomen, and breasts.  You may develop headaches that can be relieved by medicines approved by your health care provider.  You may urinate more often because the fetus is pressing on your bladder.  You may develop or continue to have heartburn as a result of your pregnancy.  You may develop constipation because certain hormones are causing the muscles that push waste through your intestines to slow down.  You may develop hemorrhoids or swollen, bulging veins (varicose veins).  You may have back pain because of the weight gain and pregnancy hormones relaxing your joints between the bones in your pelvis and as a result of a shift in weight and the muscles that support your balance.  Your breasts will continue to grow and be tender.  Your gums may bleed and may be sensitive to brushing and flossing.  Dark spots or blotches (chloasma, mask of pregnancy) may develop on your face. This will likely fade after the baby is born.  A dark line from your belly button to the pubic area (linea nigra) may appear. This will likely  fade after the baby is born.  You may have changes in your hair. These can include thickening of your hair, rapid growth, and changes in texture. Some women also have hair loss during or after pregnancy, or hair that feels dry or thin. Your hair will most likely return to normal after your baby is born. WHAT TO EXPECT AT YOUR PRENATAL VISITS During a routine prenatal visit:  You will be weighed to make sure you and the fetus are growing normally.  Your blood pressure will be taken.  Your abdomen will be measured to track your baby's growth.  The fetal heartbeat will be listened to.  Any test results from the previous visit will be discussed. Your health care provider may ask you:  How you are feeling.  If you are feeling the baby move.  If you have had any abnormal symptoms, such as leaking fluid, bleeding, severe headaches, or abdominal cramping.  If you have any questions. Other tests that may be performed during your second trimester include:  Blood tests that check for:  Low iron levels (anemia).  Gestational diabetes (between 24 and 28 weeks).  Rh antibodies.  Urine tests to check for infections, diabetes, or protein in the urine.  An ultrasound to confirm the proper growth and development of the baby.  An amniocentesis to check for possible genetic problems.  Fetal screens for spina bifida and Down syndrome. HOME CARE INSTRUCTIONS   Avoid all smoking, herbs, alcohol, and unprescribed   drugs. These chemicals affect the formation and growth of the baby.  Follow your health care provider's instructions regarding medicine use. There are medicines that are either safe or unsafe to take during pregnancy.  Exercise only as directed by your health care provider. Experiencing uterine cramps is a good sign to stop exercising.  Continue to eat regular, healthy meals.  Wear a good support bra for breast tenderness.  Do not use hot tubs, steam rooms, or saunas.  Wear  your seat belt at all times when driving.  Avoid raw meat, uncooked cheese, cat litter boxes, and soil used by cats. These carry germs that can cause birth defects in the baby.  Take your prenatal vitamins.  Try taking a stool softener (if your health care provider approves) if you develop constipation. Eat more high-fiber foods, such as fresh vegetables or fruit and whole grains. Drink plenty of fluids to keep your urine clear or pale yellow.  Take warm sitz baths to soothe any pain or discomfort caused by hemorrhoids. Use hemorrhoid cream if your health care provider approves.  If you develop varicose veins, wear support hose. Elevate your feet for 15 minutes, 3-4 times a day. Limit salt in your diet.  Avoid heavy lifting, wear low heel shoes, and practice good posture.  Rest with your legs elevated if you have leg cramps or low back pain.  Visit your dentist if you have not gone yet during your pregnancy. Use a soft toothbrush to brush your teeth and be gentle when you floss.  A sexual relationship may be continued unless your health care provider directs you otherwise.  Continue to go to all your prenatal visits as directed by your health care provider. SEEK MEDICAL CARE IF:   You have dizziness.  You have mild pelvic cramps, pelvic pressure, or nagging pain in the abdominal area.  You have persistent nausea, vomiting, or diarrhea.  You have a bad smelling vaginal discharge.  You have pain with urination. SEEK IMMEDIATE MEDICAL CARE IF:   You have a fever.  You are leaking fluid from your vagina.  You have spotting or bleeding from your vagina.  You have severe abdominal cramping or pain.  You have rapid weight gain or loss.  You have shortness of breath with chest pain.  You notice sudden or extreme swelling of your face, hands, ankles, feet, or legs.  You have not felt your baby move in over an hour.  You have severe headaches that do not go away with  medicine.  You have vision changes. Document Released: 05/04/2001 Document Revised: 05/15/2013 Document Reviewed: 07/11/2012 [employer] Patient Information 2015 [employer]. This information is not intended to replace advice given to you by your health care provider. Make sure you discuss any questions you have with your health care provider.  Contraception Choices Contraception (birth control) is the use of any methods or devices to prevent pregnancy. Below are some methods to help avoid pregnancy. HORMONAL METHODS   Contraceptive implant. This is a thin, plastic tube containing progesterone hormone. It does not contain estrogen hormone. Your health care provider inserts the tube in the inner part of the upper arm. The tube can remain in place for up to 3 years. After 3 years, the implant must be removed. The implant prevents the ovaries from releasing an egg (ovulation), thickens the cervical mucus to prevent sperm from entering the uterus, and thins the lining of the inside of the uterus.  Progesterone-only injections. These injections are given   every 3 months by your health care provider to prevent pregnancy. This synthetic progesterone hormone stops the ovaries from releasing eggs. It also thickens cervical mucus and changes the uterine lining. This makes it harder for sperm to survive in the uterus.  Birth control pills. These pills contain estrogen and progesterone hormone. They work by preventing the ovaries from releasing eggs (ovulation). They also cause the cervical mucus to thicken, preventing the sperm from entering the uterus. Birth control pills are prescribed by a health care provider.Birth control pills can also be used to treat heavy periods.  Minipill. This type of birth control pill contains only the progesterone hormone. They are taken every day of each month and must be prescribed by your health care provider.  Birth control patch. The patch contains hormones similar to  those in birth control pills. It must be changed once a week and is prescribed by a health care provider.  Vaginal ring. The ring contains hormones similar to those in birth control pills. It is left in the vagina for 3 weeks, removed for 1 week, and then a new one is put back in place. The patient must be comfortable inserting and removing the ring from the vagina.A health care provider's prescription is necessary.  Emergency contraception. Emergency contraceptives prevent pregnancy after unprotected sexual intercourse. This pill can be taken right after sex or up to 5 days after unprotected sex. It is most effective the sooner you take the pills after having sexual intercourse. Most emergency contraceptive pills are available without a prescription. Check with your pharmacist. Do not use emergency contraception as your only form of birth control. BARRIER METHODS   Female condom. This is a thin sheath (latex or rubber) that is worn over the penis during sexual intercourse. It can be used with spermicide to increase effectiveness.  Female condom. This is a soft, loose-fitting sheath that is put into the vagina before sexual intercourse.  Diaphragm. This is a soft, latex, dome-shaped barrier that must be fitted by a health care provider. It is inserted into the vagina, along with a spermicidal jelly. It is inserted before intercourse. The diaphragm should be left in the vagina for 6 to 8 hours after intercourse.  Cervical cap. This is a round, soft, latex or plastic cup that fits over the cervix and must be fitted by a health care provider. The cap can be left in place for up to 48 hours after intercourse.  Sponge. This is a soft, circular piece of polyurethane foam. The sponge has spermicide in it. It is inserted into the vagina after wetting it and before sexual intercourse.  Spermicides. These are chemicals that kill or block sperm from entering the cervix and uterus. They come in the form of  creams, jellies, suppositories, foam, or tablets. They do not require a prescription. They are inserted into the vagina with an applicator before having sexual intercourse. The process must be repeated every time you have sexual intercourse. INTRAUTERINE CONTRACEPTION  Intrauterine device (IUD). This is a T-shaped device that is put in a woman's uterus during a menstrual period to prevent pregnancy. There are 2 types:  Copper IUD. This type of IUD is wrapped in copper wire and is placed inside the uterus. Copper makes the uterus and fallopian tubes produce a fluid that kills sperm. It can stay in place for 10 years.  Hormone IUD. This type of IUD contains the hormone progestin (synthetic progesterone). The hormone thickens the cervical mucus and prevents sperm from   entering the uterus, and it also thins the uterine lining to prevent implantation of a fertilized egg. The hormone can weaken or kill the sperm that get into the uterus. It can stay in place for 3-5 years, depending on which type of IUD is used. PERMANENT METHODS OF CONTRACEPTION  Female tubal ligation. This is when the woman's fallopian tubes are surgically sealed, tied, or blocked to prevent the egg from traveling to the uterus.  Hysteroscopic sterilization. This involves placing a small coil or insert into each fallopian tube. Your doctor uses a technique called hysteroscopy to do the procedure. The device causes scar tissue to form. This results in permanent blockage of the fallopian tubes, so the sperm cannot fertilize the egg. It takes about 3 months after the procedure for the tubes to become blocked. You must use another form of birth control for these 3 months.  Female sterilization. This is when the female has the tubes that carry sperm tied off (vasectomy).This blocks sperm from entering the vagina during sexual intercourse. After the procedure, the man can still ejaculate fluid (semen). NATURAL PLANNING METHODS  Natural family  planning. This is not having sexual intercourse or using a barrier method (condom, diaphragm, cervical cap) on days the woman could become pregnant.  Calendar method. This is keeping track of the length of each menstrual cycle and identifying when you are fertile.  Ovulation method. This is avoiding sexual intercourse during ovulation.  Symptothermal method. This is avoiding sexual intercourse during ovulation, using a thermometer and ovulation symptoms.  Post-ovulation method. This is timing sexual intercourse after you have ovulated. Regardless of which type or method of contraception you choose, it is important that you use condoms to protect against the transmission of sexually transmitted infections (STIs). Talk with your health care provider about which form of contraception is most appropriate for you. Document Released: 05/10/2005 Document Revised: 05/15/2013 Document Reviewed: 11/02/2012 [employer] Patient Information 2015 [employer]. This information is not intended to replace advice given to you by your health care provider. Make sure you discuss any questions you have with your health care provider.  Breastfeeding Deciding to breastfeed is one of the best choices you can make for you and your baby. A change in hormones during pregnancy causes your breast tissue to grow and increases the number and size of your milk ducts. These hormones also allow proteins, sugars, and fats from your blood supply to make breast milk in your milk-producing glands. Hormones prevent breast milk from being released before your baby is born as well as prompt milk flow after birth. Once breastfeeding has begun, thoughts of your baby, as well as his or her sucking or crying, can stimulate the release of milk from your milk-producing glands.  BENEFITS OF BREASTFEEDING For Your Baby  Your first milk (colostrum) helps your baby's digestive system function better.   There are antibodies in your milk that  help your baby fight off infections.   Your baby has a lower incidence of asthma, allergies, and sudden infant death syndrome.   The nutrients in breast milk are better for your baby than infant formulas and are designed uniquely for your baby's needs.   Breast milk improves your baby's brain development.   Your baby is less likely to develop other conditions, such as childhood obesity, asthma, or type 2 diabetes mellitus.  For You   Breastfeeding helps to create a very special bond between you and your baby.   Breastfeeding is convenient. Breast milk is   always available at the correct temperature and costs nothing.   Breastfeeding helps to burn calories and helps you lose the weight gained during pregnancy.   Breastfeeding makes your uterus contract to its prepregnancy size faster and slows bleeding (lochia) after you give birth.   Breastfeeding helps to lower your risk of developing type 2 diabetes mellitus, osteoporosis, and breast or ovarian cancer later in life. SIGNS THAT YOUR BABY IS HUNGRY Early Signs of Hunger  Increased alertness or activity.  Stretching.  Movement of the head from side to side.  Movement of the head and opening of the mouth when the corner of the mouth or cheek is stroked (rooting).  Increased sucking sounds, smacking lips, cooing, sighing, or squeaking.  Hand-to-mouth movements.  Increased sucking of fingers or hands. Late Signs of Hunger  Fussing.  Intermittent crying. Extreme Signs of Hunger Signs of extreme hunger will require calming and consoling before your baby will be able to breastfeed successfully. Do not wait for the following signs of extreme hunger to occur before you initiate breastfeeding:   Restlessness.  A loud, strong cry.   Screaming. BREASTFEEDING BASICS Breastfeeding Initiation  Find a comfortable place to sit or lie down, with your neck and back well supported.  Place a pillow or rolled up blanket  under your baby to bring him or her to the level of your breast (if you are seated). Nursing pillows are specially designed to help support your arms and your baby while you breastfeed.  Make sure that your baby's abdomen is facing your abdomen.   Gently massage your breast. With your fingertips, massage from your chest wall toward your nipple in a circular motion. This encourages milk flow. You may need to continue this action during the feeding if your milk flows slowly.  Support your breast with 4 fingers underneath and your thumb above your nipple. Make sure your fingers are well away from your nipple and your baby's mouth.   Stroke your baby's lips gently with your finger or nipple.   When your baby's mouth is open wide enough, quickly bring your baby to your breast, placing your entire nipple and as much of the colored area around your nipple (areola) as possible into your baby's mouth.   More areola should be visible above your baby's upper lip than below the lower lip.   Your baby's tongue should be between his or her lower gum and your breast.   Ensure that your baby's mouth is correctly positioned around your nipple (latched). Your baby's lips should create a seal on your breast and be turned out (everted).  It is common for your baby to suck about 2-3 minutes in order to start the flow of breast milk. Latching Teaching your baby how to latch on to your breast properly is very important. An improper latch can cause nipple pain and decreased milk supply for you and poor weight gain in your baby. Also, if your baby is not latched onto your nipple properly, he or she may swallow some air during feeding. This can make your baby fussy. Burping your baby when you switch breasts during the feeding can help to get rid of the air. However, teaching your baby to latch on properly is still the best way to prevent fussiness from swallowing air while breastfeeding. Signs that your baby has  successfully latched on to your nipple:    Silent tugging or silent sucking, without causing you pain.   Swallowing heard between every 3-4   sucks.    Muscle movement above and in front of his or her ears while sucking.  Signs that your baby has not successfully latched on to nipple:   Sucking sounds or smacking sounds from your baby while breastfeeding.  Nipple pain. If you think your baby has not latched on correctly, slip your finger into the corner of your baby's mouth to break the suction and place it between your baby's gums. Attempt breastfeeding initiation again. Signs of Successful Breastfeeding Signs from your baby:   A gradual decrease in the number of sucks or complete cessation of sucking.   Falling asleep.   Relaxation of his or her body.   Retention of a small amount of milk in his or her mouth.   Letting go of your breast by himself or herself. Signs from you:  Breasts that have increased in firmness, weight, and size 1-3 hours after feeding.   Breasts that are softer immediately after breastfeeding.  Increased milk volume, as well as a change in milk consistency and color by the fifth day of breastfeeding.   Nipples that are not sore, cracked, or bleeding. Signs That Your Baby is Getting Enough Milk  Wetting at least 3 diapers in a 24-hour period. The urine should be clear and pale yellow by age 5 days.  At least 3 stools in a 24-hour period by age 5 days. The stool should be soft and yellow.  At least 3 stools in a 24-hour period by age 7 days. The stool should be seedy and yellow.  No loss of weight greater than 10% of birth weight during the first 3 days of age.  Average weight gain of 4-7 ounces (113-198 g) per week after age 4 days.  Consistent daily weight gain by age 5 days, without weight loss after the age of 2 weeks. After a feeding, your baby may spit up a small amount. This is common. BREASTFEEDING FREQUENCY AND DURATION Frequent  feeding will help you make more milk and can prevent sore nipples and breast engorgement. Breastfeed when you feel the need to reduce the fullness of your breasts or when your baby shows signs of hunger. This is called "breastfeeding on demand." Avoid introducing a pacifier to your baby while you are working to establish breastfeeding (the first 4-6 weeks after your baby is born). After this time you may choose to use a pacifier. Research has shown that pacifier use during the first year of a baby's life decreases the risk of sudden infant death syndrome (SIDS). Allow your baby to feed on each breast as long as he or she wants. Breastfeed until your baby is finished feeding. When your baby unlatches or falls asleep while feeding from the first breast, offer the second breast. Because newborns are often sleepy in the first few weeks of life, you may need to awaken your baby to get him or her to feed. Breastfeeding times will vary from baby to baby. However, the following rules can serve as a guide to help you ensure that your baby is properly fed:  Newborns (babies 4 weeks of age or younger) may breastfeed every 1-3 hours.  Newborns should not go longer than 3 hours during the day or 5 hours during the night without breastfeeding.  You should breastfeed your baby a minimum of 8 times in a 24-hour period until you begin to introduce solid foods to your baby at around 6 months of age. BREAST MILK PUMPING Pumping and storing breast milk allows   you to ensure that your baby is exclusively fed your breast milk, even at times when you are unable to breastfeed. This is especially important if you are going back to work while you are still breastfeeding or when you are not able to be present during feedings. Your lactation consultant can give you guidelines on how long it is safe to store breast milk.  A breast pump is a machine that allows you to pump milk from your breast into a sterile bottle. The pumped breast  milk can then be stored in a refrigerator or freezer. Some breast pumps are operated by hand, while others use electricity. Ask your lactation consultant which type will work best for you. Breast pumps can be purchased, but some hospitals and breastfeeding support groups lease breast pumps on a monthly basis. A lactation consultant can teach you how to hand express breast milk, if you prefer not to use a pump.  CARING FOR YOUR BREASTS WHILE YOU BREASTFEED Nipples can become dry, cracked, and sore while breastfeeding. The following recommendations can help keep your breasts moisturized and healthy:  Avoid using soap on your nipples.   Wear a supportive bra. Although not required, special nursing bras and tank tops are designed to allow access to your breasts for breastfeeding without taking off your entire bra or top. Avoid wearing underwire-style bras or extremely tight bras.  Air dry your nipples for 3-4minutes after each feeding.   Use only cotton bra pads to absorb leaked breast milk. Leaking of breast milk between feedings is normal.   Use lanolin on your nipples after breastfeeding. Lanolin helps to maintain your skin's normal moisture barrier. If you use pure lanolin, you do not need to wash it off before feeding your baby again. Pure lanolin is not toxic to your baby. You may also hand express a few drops of breast milk and gently massage that milk into your nipples and allow the milk to air dry. In the first few weeks after giving birth, some women experience extremely full breasts (engorgement). Engorgement can make your breasts feel heavy, warm, and tender to the touch. Engorgement peaks within 3-5 days after you give birth. The following recommendations can help ease engorgement:  Completely empty your breasts while breastfeeding or pumping. You may want to start by applying warm, moist heat (in the shower or with warm water-soaked hand towels) just before feeding or pumping. This  increases circulation and helps the milk flow. If your baby does not completely empty your breasts while breastfeeding, pump any extra milk after he or she is finished.  Wear a snug bra (nursing or regular) or tank top for 1-2 days to signal your body to slightly decrease milk production.  Apply ice packs to your breasts, unless this is too uncomfortable for you.  Make sure that your baby is latched on and positioned properly while breastfeeding. If engorgement persists after 48 hours of following these recommendations, contact your health care provider or a lactation consultant. OVERALL HEALTH CARE RECOMMENDATIONS WHILE BREASTFEEDING  Eat healthy foods. Alternate between meals and snacks, eating 3 of each per day. Because what you eat affects your breast milk, some of the foods may make your baby more irritable than usual. Avoid eating these foods if you are sure that they are negatively affecting your baby.  Drink milk, fruit juice, and water to satisfy your thirst (about 10 glasses a day).   Rest often, relax, and continue to take your prenatal vitamins to prevent fatigue,   stress, and anemia.  Continue breast self-awareness checks.  Avoid chewing and smoking tobacco.  Avoid alcohol and drug use. Some medicines that may be harmful to your baby can pass through breast milk. It is important to ask your health care provider before taking any medicine, including all over-the-counter and prescription medicine as well as vitamin and herbal supplements. It is possible to become pregnant while breastfeeding. If birth control is desired, ask your health care provider about options that will be safe for your baby. SEEK MEDICAL CARE IF:   You feel like you want to stop breastfeeding or have become frustrated with breastfeeding.  You have painful breasts or nipples.  Your nipples are cracked or bleeding.  Your breasts are red, tender, or warm.  You have a swollen area on either breast.  You  have a fever or chills.  You have nausea or vomiting.  You have drainage other than breast milk from your nipples.  Your breasts do not become full before feedings by the fifth day after you give birth.  You feel sad and depressed.  Your baby is too sleepy to eat well.  Your baby is having trouble sleeping.   Your baby is wetting less than 3 diapers in a 24-hour period.  Your baby has less than 3 stools in a 24-hour period.  Your baby's skin or the white part of his or her eyes becomes yellow.   Your baby is not gaining weight by 5 days of age. SEEK IMMEDIATE MEDICAL CARE IF:   Your baby is overly tired (lethargic) and does not want to wake up and feed.  Your baby develops an unexplained fever. Document Released: 05/10/2005 Document Revised: 05/15/2013 Document Reviewed: 11/01/2012 [employer] Patient Information 2015 [employer]. This information is not intended to replace advice given to you by your health care provider. Make sure you discuss any questions you have with your health care provider.  

## 2014-01-01 ENCOUNTER — Encounter: Payer: Self-pay | Admitting: Family Medicine

## 2014-01-01 ENCOUNTER — Ambulatory Visit (INDEPENDENT_AMBULATORY_CARE_PROVIDER_SITE_OTHER): Payer: BC Managed Care – PPO | Admitting: Family Medicine

## 2014-01-01 VITALS — BP 124/93 | HR 124 | Wt 267.0 lb

## 2014-01-01 DIAGNOSIS — O10019 Pre-existing essential hypertension complicating pregnancy, unspecified trimester: Secondary | ICD-10-CM

## 2014-01-01 DIAGNOSIS — Z3480 Encounter for supervision of other normal pregnancy, unspecified trimester: Secondary | ICD-10-CM

## 2014-01-01 DIAGNOSIS — Z3482 Encounter for supervision of other normal pregnancy, second trimester: Secondary | ICD-10-CM

## 2014-01-01 NOTE — Patient Instructions (Signed)
Second Trimester of Pregnancy The second trimester is from week 13 through week 28, months 4 through 6. The second trimester is often a time when you feel your best. Your body has also adjusted to being pregnant, and you begin to feel better physically. Usually, morning sickness has lessened or quit completely, you may have more energy, and you may have an increase in appetite. The second trimester is also a time when the fetus is growing rapidly. At the end of the sixth month, the fetus is about 9 inches long and weighs about 1 pounds. You will likely begin to feel the baby move (quickening) between 18 and 20 weeks of the pregnancy. BODY CHANGES Your body goes through many changes during pregnancy. The changes vary from woman to woman.   Your weight will continue to increase. You will notice your lower abdomen bulging out.  You may begin to get stretch marks on your hips, abdomen, and breasts.  You may develop headaches that can be relieved by medicines approved by your health care provider.  You may urinate more often because the fetus is pressing on your bladder.  You may develop or continue to have heartburn as a result of your pregnancy.  You may develop constipation because certain hormones are causing the muscles that push waste through your intestines to slow down.  You may develop hemorrhoids or swollen, bulging veins (varicose veins).  You may have back pain because of the weight gain and pregnancy hormones relaxing your joints between the bones in your pelvis and as a result of a shift in weight and the muscles that support your balance.  Your breasts will continue to grow and be tender.  Your gums may bleed and may be sensitive to brushing and flossing.  Dark spots or blotches (chloasma, mask of pregnancy) may develop on your face. This will likely fade after the baby is born.  A dark line from your belly button to the pubic area (linea nigra) may appear. This will likely  fade after the baby is born.  You may have changes in your hair. These can include thickening of your hair, rapid growth, and changes in texture. Some women also have hair loss during or after pregnancy, or hair that feels dry or thin. Your hair will most likely return to normal after your baby is born. WHAT TO EXPECT AT YOUR PRENATAL VISITS During a routine prenatal visit:  You will be weighed to make sure you and the fetus are growing normally.  Your blood pressure will be taken.  Your abdomen will be measured to track your baby's growth.  The fetal heartbeat will be listened to.  Any test results from the previous visit will be discussed. Your health care provider may ask you:  How you are feeling.  If you are feeling the baby move.  If you have had any abnormal symptoms, such as leaking fluid, bleeding, severe headaches, or abdominal cramping.  If you have any questions. Other tests that may be performed during your second trimester include:  Blood tests that check for:  Low iron levels (anemia).  Gestational diabetes (between 24 and 28 weeks).  Rh antibodies.  Urine tests to check for infections, diabetes, or protein in the urine.  An ultrasound to confirm the proper growth and development of the baby.  An amniocentesis to check for possible genetic problems.  Fetal screens for spina bifida and Down syndrome. HOME CARE INSTRUCTIONS   Avoid all smoking, herbs, alcohol, and unprescribed   drugs. These chemicals affect the formation and growth of the baby.  Follow your health care provider's instructions regarding medicine use. There are medicines that are either safe or unsafe to take during pregnancy.  Exercise only as directed by your health care provider. Experiencing uterine cramps is a good sign to stop exercising.  Continue to eat regular, healthy meals.  Wear a good support bra for breast tenderness.  Do not use hot tubs, steam rooms, or saunas.  Wear  your seat belt at all times when driving.  Avoid raw meat, uncooked cheese, cat litter boxes, and soil used by cats. These carry germs that can cause birth defects in the baby.  Take your prenatal vitamins.  Try taking a stool softener (if your health care provider approves) if you develop constipation. Eat more high-fiber foods, such as fresh vegetables or fruit and whole grains. Drink plenty of fluids to keep your urine clear or pale yellow.  Take warm sitz baths to soothe any pain or discomfort caused by hemorrhoids. Use hemorrhoid cream if your health care provider approves.  If you develop varicose veins, wear support hose. Elevate your feet for 15 minutes, 3-4 times a day. Limit salt in your diet.  Avoid heavy lifting, wear low heel shoes, and practice good posture.  Rest with your legs elevated if you have leg cramps or low back pain.  Visit your dentist if you have not gone yet during your pregnancy. Use a soft toothbrush to brush your teeth and be gentle when you floss.  A sexual relationship may be continued unless your health care provider directs you otherwise.  Continue to go to all your prenatal visits as directed by your health care provider. SEEK MEDICAL CARE IF:   You have dizziness.  You have mild pelvic cramps, pelvic pressure, or nagging pain in the abdominal area.  You have persistent nausea, vomiting, or diarrhea.  You have a bad smelling vaginal discharge.  You have pain with urination. SEEK IMMEDIATE MEDICAL CARE IF:   You have a fever.  You are leaking fluid from your vagina.  You have spotting or bleeding from your vagina.  You have severe abdominal cramping or pain.  You have rapid weight gain or loss.  You have shortness of breath with chest pain.  You notice sudden or extreme swelling of your face, hands, ankles, feet, or legs.  You have not felt your baby move in over an hour.  You have severe headaches that do not go away with  medicine.  You have vision changes. Document Released: 05/04/2001 Document Revised: 05/15/2013 Document Reviewed: 07/11/2012 [employer] Patient Information 2015 [employer]. This information is not intended to replace advice given to you by your health care provider. Make sure you discuss any questions you have with your health care provider.  Breastfeeding Deciding to breastfeed is one of the best choices you can make for you and your baby. A change in hormones during pregnancy causes your breast tissue to grow and increases the number and size of your milk ducts. These hormones also allow proteins, sugars, and fats from your blood supply to make breast milk in your milk-producing glands. Hormones prevent breast milk from being released before your baby is born as well as prompt milk flow after birth. Once breastfeeding has begun, thoughts of your baby, as well as his or her sucking or crying, can stimulate the release of milk from your milk-producing glands.  BENEFITS OF BREASTFEEDING For Your Baby  Your first   milk (colostrum) helps your baby's digestive system function better.   There are antibodies in your milk that help your baby fight off infections.   Your baby has a lower incidence of asthma, allergies, and sudden infant death syndrome.   The nutrients in breast milk are better for your baby than infant formulas and are designed uniquely for your baby's needs.   Breast milk improves your baby's brain development.   Your baby is less likely to develop other conditions, such as childhood obesity, asthma, or type 2 diabetes mellitus.  For You   Breastfeeding helps to create a very special bond between you and your baby.   Breastfeeding is convenient. Breast milk is always available at the correct temperature and costs nothing.   Breastfeeding helps to burn calories and helps you lose the weight gained during pregnancy.   Breastfeeding makes your uterus contract to its  prepregnancy size faster and slows bleeding (lochia) after you give birth.   Breastfeeding helps to lower your risk of developing type 2 diabetes mellitus, osteoporosis, and breast or ovarian cancer later in life. SIGNS THAT YOUR BABY IS HUNGRY Early Signs of Hunger  Increased alertness or activity.  Stretching.  Movement of the head from side to side.  Movement of the head and opening of the mouth when the corner of the mouth or cheek is stroked (rooting).  Increased sucking sounds, smacking lips, cooing, sighing, or squeaking.  Hand-to-mouth movements.  Increased sucking of fingers or hands. Late Signs of Hunger  Fussing.  Intermittent crying. Extreme Signs of Hunger Signs of extreme hunger will require calming and consoling before your baby will be able to breastfeed successfully. Do not wait for the following signs of extreme hunger to occur before you initiate breastfeeding:   Restlessness.  A loud, strong cry.   Screaming. BREASTFEEDING BASICS Breastfeeding Initiation  Find a comfortable place to sit or lie down, with your neck and back well supported.  Place a pillow or rolled up blanket under your baby to bring him or her to the level of your breast (if you are seated). Nursing pillows are specially designed to help support your arms and your baby while you breastfeed.  Make sure that your baby's abdomen is facing your abdomen.   Gently massage your breast. With your fingertips, massage from your chest wall toward your nipple in a circular motion. This encourages milk flow. You may need to continue this action during the feeding if your milk flows slowly.  Support your breast with 4 fingers underneath and your thumb above your nipple. Make sure your fingers are well away from your nipple and your baby's mouth.   Stroke your baby's lips gently with your finger or nipple.   When your baby's mouth is open wide enough, quickly bring your baby to your breast,  placing your entire nipple and as much of the colored area around your nipple (areola) as possible into your baby's mouth.   More areola should be visible above your baby's upper lip than below the lower lip.   Your baby's tongue should be between his or her lower gum and your breast.   Ensure that your baby's mouth is correctly positioned around your nipple (latched). Your baby's lips should create a seal on your breast and be turned out (everted).  It is common for your baby to suck about 2-3 minutes in order to start the flow of breast milk. Latching Teaching your baby how to latch on to your breast   properly is very important. An improper latch can cause nipple pain and decreased milk supply for you and poor weight gain in your baby. Also, if your baby is not latched onto your nipple properly, he or she may swallow some air during feeding. This can make your baby fussy. Burping your baby when you switch breasts during the feeding can help to get rid of the air. However, teaching your baby to latch on properly is still the best way to prevent fussiness from swallowing air while breastfeeding. Signs that your baby has successfully latched on to your nipple:    Silent tugging or silent sucking, without causing you pain.   Swallowing heard between every 3-4 sucks.    Muscle movement above and in front of his or her ears while sucking.  Signs that your baby has not successfully latched on to nipple:   Sucking sounds or smacking sounds from your baby while breastfeeding.  Nipple pain. If you think your baby has not latched on correctly, slip your finger into the corner of your baby's mouth to break the suction and place it between your baby's gums. Attempt breastfeeding initiation again. Signs of Successful Breastfeeding Signs from your baby:   A gradual decrease in the number of sucks or complete cessation of sucking.   Falling asleep.   Relaxation of his or her body.    Retention of a small amount of milk in his or her mouth.   Letting go of your breast by himself or herself. Signs from you:  Breasts that have increased in firmness, weight, and size 1-3 hours after feeding.   Breasts that are softer immediately after breastfeeding.  Increased milk volume, as well as a change in milk consistency and color by the fifth day of breastfeeding.   Nipples that are not sore, cracked, or bleeding. Signs That Your Baby is Getting Enough Milk  Wetting at least 3 diapers in a 24-hour period. The urine should be clear and pale yellow by age 5 days.  At least 3 stools in a 24-hour period by age 5 days. The stool should be soft and yellow.  At least 3 stools in a 24-hour period by age 7 days. The stool should be seedy and yellow.  No loss of weight greater than 10% of birth weight during the first 3 days of age.  Average weight gain of 4-7 ounces (113-198 g) per week after age 4 days.  Consistent daily weight gain by age 5 days, without weight loss after the age of 2 weeks. After a feeding, your baby may spit up a small amount. This is common. BREASTFEEDING FREQUENCY AND DURATION Frequent feeding will help you make more milk and can prevent sore nipples and breast engorgement. Breastfeed when you feel the need to reduce the fullness of your breasts or when your baby shows signs of hunger. This is called "breastfeeding on demand." Avoid introducing a pacifier to your baby while you are working to establish breastfeeding (the first 4-6 weeks after your baby is born). After this time you may choose to use a pacifier. Research has shown that pacifier use during the first year of a baby's life decreases the risk of sudden infant death syndrome (SIDS). Allow your baby to feed on each breast as long as he or she wants. Breastfeed until your baby is finished feeding. When your baby unlatches or falls asleep while feeding from the first breast, offer the second breast.  Because newborns are often sleepy in the   first few weeks of life, you may need to awaken your baby to get him or her to feed. Breastfeeding times will vary from baby to baby. However, the following rules can serve as a guide to help you ensure that your baby is properly fed:  Newborns (babies 4 weeks of age or younger) may breastfeed every 1-3 hours.  Newborns should not go longer than 3 hours during the day or 5 hours during the night without breastfeeding.  You should breastfeed your baby a minimum of 8 times in a 24-hour period until you begin to introduce solid foods to your baby at around 6 months of age. BREAST MILK PUMPING Pumping and storing breast milk allows you to ensure that your baby is exclusively fed your breast milk, even at times when you are unable to breastfeed. This is especially important if you are going back to work while you are still breastfeeding or when you are not able to be present during feedings. Your lactation consultant can give you guidelines on how long it is safe to store breast milk.  A breast pump is a machine that allows you to pump milk from your breast into a sterile bottle. The pumped breast milk can then be stored in a refrigerator or freezer. Some breast pumps are operated by hand, while others use electricity. Ask your lactation consultant which type will work best for you. Breast pumps can be purchased, but some hospitals and breastfeeding support groups lease breast pumps on a monthly basis. A lactation consultant can teach you how to hand express breast milk, if you prefer not to use a pump.  CARING FOR YOUR BREASTS WHILE YOU BREASTFEED Nipples can become dry, cracked, and sore while breastfeeding. The following recommendations can help keep your breasts moisturized and healthy:  Avoid using soap on your nipples.   Wear a supportive bra. Although not required, special nursing bras and tank tops are designed to allow access to your breasts for  breastfeeding without taking off your entire bra or top. Avoid wearing underwire-style bras or extremely tight bras.  Air dry your nipples for 3-4minutes after each feeding.   Use only cotton bra pads to absorb leaked breast milk. Leaking of breast milk between feedings is normal.   Use lanolin on your nipples after breastfeeding. Lanolin helps to maintain your skin's normal moisture barrier. If you use pure lanolin, you do not need to wash it off before feeding your baby again. Pure lanolin is not toxic to your baby. You may also hand express a few drops of breast milk and gently massage that milk into your nipples and allow the milk to air dry. In the first few weeks after giving birth, some women experience extremely full breasts (engorgement). Engorgement can make your breasts feel heavy, warm, and tender to the touch. Engorgement peaks within 3-5 days after you give birth. The following recommendations can help ease engorgement:  Completely empty your breasts while breastfeeding or pumping. You may want to start by applying warm, moist heat (in the shower or with warm water-soaked hand towels) just before feeding or pumping. This increases circulation and helps the milk flow. If your baby does not completely empty your breasts while breastfeeding, pump any extra milk after he or she is finished.  Wear a snug bra (nursing or regular) or tank top for 1-2 days to signal your body to slightly decrease milk production.  Apply ice packs to your breasts, unless this is too uncomfortable for you.    Make sure that your baby is latched on and positioned properly while breastfeeding. If engorgement persists after 48 hours of following these recommendations, contact your health care provider or a lactation consultant. OVERALL HEALTH CARE RECOMMENDATIONS WHILE BREASTFEEDING  Eat healthy foods. Alternate between meals and snacks, eating 3 of each per day. Because what you eat affects your breast milk,  some of the foods may make your baby more irritable than usual. Avoid eating these foods if you are sure that they are negatively affecting your baby.  Drink milk, fruit juice, and water to satisfy your thirst (about 10 glasses a day).   Rest often, relax, and continue to take your prenatal vitamins to prevent fatigue, stress, and anemia.  Continue breast self-awareness checks.  Avoid chewing and smoking tobacco.  Avoid alcohol and drug use. Some medicines that may be harmful to your baby can pass through breast milk. It is important to ask your health care provider before taking any medicine, including all over-the-counter and prescription medicine as well as vitamin and herbal supplements. It is possible to become pregnant while breastfeeding. If birth control is desired, ask your health care provider about options that will be safe for your baby. SEEK MEDICAL CARE IF:   You feel like you want to stop breastfeeding or have become frustrated with breastfeeding.  You have painful breasts or nipples.  Your nipples are cracked or bleeding.  Your breasts are red, tender, or warm.  You have a swollen area on either breast.  You have a fever or chills.  You have nausea or vomiting.  You have drainage other than breast milk from your nipples.  Your breasts do not become full before feedings by the fifth day after you give birth.  You feel sad and depressed.  Your baby is too sleepy to eat well.  Your baby is having trouble sleeping.   Your baby is wetting less than 3 diapers in a 24-hour period.  Your baby has less than 3 stools in a 24-hour period.  Your baby's skin or the white part of his or her eyes becomes yellow.   Your baby is not gaining weight by 5 days of age. SEEK IMMEDIATE MEDICAL CARE IF:   Your baby is overly tired (lethargic) and does not want to wake up and feed.  Your baby develops an unexplained fever. Document Released: 05/10/2005 Document Revised:  05/15/2013 Document Reviewed: 11/01/2012 [employer] Patient Information 2015 [employer]. This information is not intended to replace advice given to you by your health care provider. Make sure you discuss any questions you have with your health care provider.  

## 2014-01-01 NOTE — Progress Notes (Signed)
Doing well Schedule anatomy

## 2014-01-07 ENCOUNTER — Encounter: Payer: Self-pay | Admitting: Family Medicine

## 2014-01-07 ENCOUNTER — Ambulatory Visit (HOSPITAL_COMMUNITY)
Admission: RE | Admit: 2014-01-07 | Discharge: 2014-01-07 | Disposition: A | Discharge status: Home / Self Care | Payer: BC Managed Care – PPO | Source: Ambulatory Visit | Attending: Family Medicine | Admitting: Family Medicine

## 2014-01-07 DIAGNOSIS — Z3689 Encounter for other specified antenatal screening: Secondary | ICD-10-CM

## 2014-01-07 DIAGNOSIS — O99212 Obesity complicating pregnancy, second trimester: Secondary | ICD-10-CM

## 2014-01-07 DIAGNOSIS — O10019 Pre-existing essential hypertension complicating pregnancy, unspecified trimester: Secondary | ICD-10-CM | POA: Diagnosis not present

## 2014-01-29 ENCOUNTER — Ambulatory Visit (INDEPENDENT_AMBULATORY_CARE_PROVIDER_SITE_OTHER): Payer: BC Managed Care – PPO | Admitting: Obstetrics & Gynecology

## 2014-01-29 ENCOUNTER — Encounter: Payer: Self-pay | Admitting: Obstetrics & Gynecology

## 2014-01-29 VITALS — BP 144/94 | HR 116 | Wt 273.0 lb

## 2014-01-29 DIAGNOSIS — Z3482 Encounter for supervision of other normal pregnancy, second trimester: Secondary | ICD-10-CM

## 2014-01-29 DIAGNOSIS — Z3480 Encounter for supervision of other normal pregnancy, unspecified trimester: Secondary | ICD-10-CM

## 2014-01-29 NOTE — Progress Notes (Signed)
Routine visit. Good FM. We discussed weight gain and risks. She is willing to see a nutritionist. Glucola, labs, and TDAP at next. Flu vaccine today.

## 2014-02-19 ENCOUNTER — Encounter (HOSPITAL_COMMUNITY): Payer: Self-pay

## 2014-02-19 ENCOUNTER — Ambulatory Visit (INDEPENDENT_AMBULATORY_CARE_PROVIDER_SITE_OTHER): Payer: BC Managed Care – PPO | Admitting: Family Medicine

## 2014-02-19 VITALS — BP 143/81 | HR 96 | Wt 276.0 lb

## 2014-02-19 DIAGNOSIS — Z23 Encounter for immunization: Secondary | ICD-10-CM

## 2014-02-19 DIAGNOSIS — Z3480 Encounter for supervision of other normal pregnancy, unspecified trimester: Secondary | ICD-10-CM | POA: Diagnosis not present

## 2014-02-19 DIAGNOSIS — O10019 Pre-existing essential hypertension complicating pregnancy, unspecified trimester: Secondary | ICD-10-CM

## 2014-02-19 DIAGNOSIS — Z3483 Encounter for supervision of other normal pregnancy, third trimester: Secondary | ICD-10-CM

## 2014-02-19 MED ORDER — TETANUS-DIPHTH-ACELL PERTUSSIS 5-2.5-18.5 LF-MCG/0.5 IM SUSP
0.5000 mL | Freq: Once | INTRAMUSCULAR | Status: DC
Start: 2014-02-19 — End: 2014-03-12

## 2014-02-19 NOTE — Patient Instructions (Signed)
Third Trimester of Pregnancy The third trimester is from week 29 through week 42, months 7 through 9. The third trimester is a time when the fetus is growing rapidly. At the end of the ninth month, the fetus is about 20 inches in length and weighs 6-10 pounds.  BODY CHANGES Your body goes through many changes during pregnancy. The changes vary from woman to woman.   Your weight will continue to increase. You can expect to gain 25-35 pounds (11-16 kg) by the end of the pregnancy.  You may begin to get stretch marks on your hips, abdomen, and breasts.  You may urinate more often because the fetus is moving lower into your pelvis and pressing on your bladder.  You may develop or continue to have heartburn as a result of your pregnancy.  You may develop constipation because certain hormones are causing the muscles that push waste through your intestines to slow down.  You may develop hemorrhoids or swollen, bulging veins (varicose veins).  You may have pelvic pain because of the weight gain and pregnancy hormones relaxing your joints between the bones in your pelvis. Backaches may result from overexertion of the muscles supporting your posture.  You may have changes in your hair. These can include thickening of your hair, rapid growth, and changes in texture. Some women also have hair loss during or after pregnancy, or hair that feels dry or thin. Your hair will most likely return to normal after your baby is born.  Your breasts will continue to grow and be tender. A yellow discharge may leak from your breasts called colostrum.  Your belly button may stick out.  You may feel short of breath because of your expanding uterus.  You may notice the fetus "dropping," or moving lower in your abdomen.  You may have a bloody mucus discharge. This usually occurs a few days to a week before labor begins.  Your cervix becomes thin and soft (effaced) near your due date. WHAT TO EXPECT AT YOUR  PRENATAL EXAMS  You will have prenatal exams every 2 weeks until week 36. Then, you will have weekly prenatal exams. During a routine prenatal visit:  You will be weighed to make sure you and the fetus are growing normally.  Your blood pressure is taken.  Your abdomen will be measured to track your baby's growth.  The fetal heartbeat will be listened to.  Any test results from the previous visit will be discussed.  You may have a cervical check near your due date to see if you have effaced. At around 36 weeks, your caregiver will check your cervix. At the same time, your caregiver will also perform a test on the secretions of the vaginal tissue. This test is to determine if a type of bacteria, Group B streptococcus, is present. Your caregiver will explain this further. Your caregiver may ask you:  What your birth plan is.  How you are feeling.  If you are feeling the baby move.  If you have had any abnormal symptoms, such as leaking fluid, bleeding, severe headaches, or abdominal cramping.  If you have any questions. Other tests or screenings that may be performed during your third trimester include:  Blood tests that check for low iron levels (anemia).  Fetal testing to check the health, activity level, and growth of the fetus. Testing is done if you have certain medical conditions or if there are problems during the pregnancy. FALSE LABOR You may feel small, irregular contractions that   eventually go away. These are called Braxton Hicks contractions, or false labor. Contractions may last for hours, days, or even weeks before true labor sets in. If contractions come at regular intervals, intensify, or become painful, it is best to be seen by your caregiver.  SIGNS OF LABOR   Menstrual-like cramps.  Contractions that are 5 minutes apart or less.  Contractions that start on the top of the uterus and spread down to the lower abdomen and back.  A sense of increased pelvic  pressure or back pain.  A watery or bloody mucus discharge that comes from the vagina. If you have any of these signs before the 37th week of pregnancy, call your caregiver right away. You need to go to the hospital to get checked immediately. HOME CARE INSTRUCTIONS   Avoid all smoking, herbs, alcohol, and unprescribed drugs. These chemicals affect the formation and growth of the baby.  Follow your caregiver's instructions regarding medicine use. There are medicines that are either safe or unsafe to take during pregnancy.  Exercise only as directed by your caregiver. Experiencing uterine cramps is a good sign to stop exercising.  Continue to eat regular, healthy meals.  Wear a good support bra for breast tenderness.  Do not use hot tubs, steam rooms, or saunas.  Wear your seat belt at all times when driving.  Avoid raw meat, uncooked cheese, cat litter boxes, and soil used by cats. These carry germs that can cause birth defects in the baby.  Take your prenatal vitamins.  Try taking a stool softener (if your caregiver approves) if you develop constipation. Eat more high-fiber foods, such as fresh vegetables or fruit and whole grains. Drink plenty of fluids to keep your urine clear or pale yellow.  Take warm sitz baths to soothe any pain or discomfort caused by hemorrhoids. Use hemorrhoid cream if your caregiver approves.  If you develop varicose veins, wear support hose. Elevate your feet for 15 minutes, 3-4 times a day. Limit salt in your diet.  Avoid heavy lifting, wear low heal shoes, and practice good posture.  Rest a lot with your legs elevated if you have leg cramps or low back pain.  Visit your dentist if you have not gone during your pregnancy. Use a soft toothbrush to brush your teeth and be gentle when you floss.  A sexual relationship may be continued unless your caregiver directs you otherwise.  Do not travel far distances unless it is absolutely necessary and only  with the approval of your caregiver.  Take prenatal classes to understand, practice, and ask questions about the labor and delivery.  Make a trial run to the hospital.  Pack your hospital bag.  Prepare the baby's nursery.  Continue to go to all your prenatal visits as directed by your caregiver. SEEK MEDICAL CARE IF:  You are unsure if you are in labor or if your water has broken.  You have dizziness.  You have mild pelvic cramps, pelvic pressure, or nagging pain in your abdominal area.  You have persistent nausea, vomiting, or diarrhea.  You have a bad smelling vaginal discharge.  You have pain with urination. SEEK IMMEDIATE MEDICAL CARE IF:   You have a fever.  You are leaking fluid from your vagina.  You have spotting or bleeding from your vagina.  You have severe abdominal cramping or pain.  You have rapid weight loss or gain.  You have shortness of breath with chest pain.  You notice sudden or extreme swelling   of your face, hands, ankles, feet, or legs.  You have not felt your baby move in over an hour.  You have severe headaches that do not go away with medicine.  You have vision changes. Document Released: 05/04/2001 Document Revised: 05/15/2013 Document Reviewed: 07/11/2012 [employer] Patient Information 2015 [employer]. This information is not intended to replace advice given to you by your health care provider. Make sure you discuss any questions you have with your health care provider.  Breastfeeding Deciding to breastfeed is one of the best choices you can make for you and your baby. A change in hormones during pregnancy causes your breast tissue to grow and increases the number and size of your milk ducts. These hormones also allow proteins, sugars, and fats from your blood supply to make breast milk in your milk-producing glands. Hormones prevent breast milk from being released before your baby is born as well as prompt milk flow after birth. Once  breastfeeding has begun, thoughts of your baby, as well as his or her sucking or crying, can stimulate the release of milk from your milk-producing glands.  BENEFITS OF BREASTFEEDING For Your Baby  Your first milk (colostrum) helps your baby's digestive system function better.   There are antibodies in your milk that help your baby fight off infections.   Your baby has a lower incidence of asthma, allergies, and sudden infant death syndrome.   The nutrients in breast milk are better for your baby than infant formulas and are designed uniquely for your baby's needs.   Breast milk improves your baby's brain development.   Your baby is less likely to develop other conditions, such as childhood obesity, asthma, or type 2 diabetes mellitus.  For You   Breastfeeding helps to create a very special bond between you and your baby.   Breastfeeding is convenient. Breast milk is always available at the correct temperature and costs nothing.   Breastfeeding helps to burn calories and helps you lose the weight gained during pregnancy.   Breastfeeding makes your uterus contract to its prepregnancy size faster and slows bleeding (lochia) after you give birth.   Breastfeeding helps to lower your risk of developing type 2 diabetes mellitus, osteoporosis, and breast or ovarian cancer later in life. SIGNS THAT YOUR BABY IS HUNGRY Early Signs of Hunger  Increased alertness or activity.  Stretching.  Movement of the head from side to side.  Movement of the head and opening of the mouth when the corner of the mouth or cheek is stroked (rooting).  Increased sucking sounds, smacking lips, cooing, sighing, or squeaking.  Hand-to-mouth movements.  Increased sucking of fingers or hands. Late Signs of Hunger  Fussing.  Intermittent crying. Extreme Signs of Hunger Signs of extreme hunger will require calming and consoling before your baby will be able to breastfeed successfully. Do not  wait for the following signs of extreme hunger to occur before you initiate breastfeeding:   Restlessness.  A loud, strong cry.   Screaming. BREASTFEEDING BASICS Breastfeeding Initiation  Find a comfortable place to sit or lie down, with your neck and back well supported.  Place a pillow or rolled up blanket under your baby to bring him or her to the level of your breast (if you are seated). Nursing pillows are specially designed to help support your arms and your baby while you breastfeed.  Make sure that your baby's abdomen is facing your abdomen.   Gently massage your breast. With your fingertips, massage from your chest   wall toward your nipple in a circular motion. This encourages milk flow. You may need to continue this action during the feeding if your milk flows slowly.  Support your breast with 4 fingers underneath and your thumb above your nipple. Make sure your fingers are well away from your nipple and your baby's mouth.   Stroke your baby's lips gently with your finger or nipple.   When your baby's mouth is open wide enough, quickly bring your baby to your breast, placing your entire nipple and as much of the colored area around your nipple (areola) as possible into your baby's mouth.   More areola should be visible above your baby's upper lip than below the lower lip.   Your baby's tongue should be between his or her lower gum and your breast.   Ensure that your baby's mouth is correctly positioned around your nipple (latched). Your baby's lips should create a seal on your breast and be turned out (everted).  It is common for your baby to suck about 2-3 minutes in order to start the flow of breast milk. Latching Teaching your baby how to latch on to your breast properly is very important. An improper latch can cause nipple pain and decreased milk supply for you and poor weight gain in your baby. Also, if your baby is not latched onto your nipple properly, he or she  may swallow some air during feeding. This can make your baby fussy. Burping your baby when you switch breasts during the feeding can help to get rid of the air. However, teaching your baby to latch on properly is still the best way to prevent fussiness from swallowing air while breastfeeding. Signs that your baby has successfully latched on to your nipple:    Silent tugging or silent sucking, without causing you pain.   Swallowing heard between every 3-4 sucks.    Muscle movement above and in front of his or her ears while sucking.  Signs that your baby has not successfully latched on to nipple:   Sucking sounds or smacking sounds from your baby while breastfeeding.  Nipple pain. If you think your baby has not latched on correctly, slip your finger into the corner of your baby's mouth to break the suction and place it between your baby's gums. Attempt breastfeeding initiation again. Signs of Successful Breastfeeding Signs from your baby:   A gradual decrease in the number of sucks or complete cessation of sucking.   Falling asleep.   Relaxation of his or her body.   Retention of a small amount of milk in his or her mouth.   Letting go of your breast by himself or herself. Signs from you:  Breasts that have increased in firmness, weight, and size 1-3 hours after feeding.   Breasts that are softer immediately after breastfeeding.  Increased milk volume, as well as a change in milk consistency and color by the fifth day of breastfeeding.   Nipples that are not sore, cracked, or bleeding. Signs That Your Baby is Getting Enough Milk  Wetting at least 3 diapers in a 24-hour period. The urine should be clear and pale yellow by age 5 days.  At least 3 stools in a 24-hour period by age 5 days. The stool should be soft and yellow.  At least 3 stools in a 24-hour period by age 7 days. The stool should be seedy and yellow.  No loss of weight greater than 10% of birth weight  during the first 3   days of age.  Average weight gain of 4-7 ounces (113-198 g) per week after age 4 days.  Consistent daily weight gain by age 5 days, without weight loss after the age of 2 weeks. After a feeding, your baby may spit up a small amount. This is common. BREASTFEEDING FREQUENCY AND DURATION Frequent feeding will help you make more milk and can prevent sore nipples and breast engorgement. Breastfeed when you feel the need to reduce the fullness of your breasts or when your baby shows signs of hunger. This is called "breastfeeding on demand." Avoid introducing a pacifier to your baby while you are working to establish breastfeeding (the first 4-6 weeks after your baby is born). After this time you may choose to use a pacifier. Research has shown that pacifier use during the first year of a baby's life decreases the risk of sudden infant death syndrome (SIDS). Allow your baby to feed on each breast as long as he or she wants. Breastfeed until your baby is finished feeding. When your baby unlatches or falls asleep while feeding from the first breast, offer the second breast. Because newborns are often sleepy in the first few weeks of life, you may need to awaken your baby to get him or her to feed. Breastfeeding times will vary from baby to baby. However, the following rules can serve as a guide to help you ensure that your baby is properly fed:  Newborns (babies 4 weeks of age or younger) may breastfeed every 1-3 hours.  Newborns should not go longer than 3 hours during the day or 5 hours during the night without breastfeeding.  You should breastfeed your baby a minimum of 8 times in a 24-hour period until you begin to introduce solid foods to your baby at around 6 months of age. BREAST MILK PUMPING Pumping and storing breast milk allows you to ensure that your baby is exclusively fed your breast milk, even at times when you are unable to breastfeed. This is especially important if you are  going back to work while you are still breastfeeding or when you are not able to be present during feedings. Your lactation consultant can give you guidelines on how long it is safe to store breast milk.  A breast pump is a machine that allows you to pump milk from your breast into a sterile bottle. The pumped breast milk can then be stored in a refrigerator or freezer. Some breast pumps are operated by hand, while others use electricity. Ask your lactation consultant which type will work best for you. Breast pumps can be purchased, but some hospitals and breastfeeding support groups lease breast pumps on a monthly basis. A lactation consultant can teach you how to hand express breast milk, if you prefer not to use a pump.  CARING FOR YOUR BREASTS WHILE YOU BREASTFEED Nipples can become dry, cracked, and sore while breastfeeding. The following recommendations can help keep your breasts moisturized and healthy:  Avoid using soap on your nipples.   Wear a supportive bra. Although not required, special nursing bras and tank tops are designed to allow access to your breasts for breastfeeding without taking off your entire bra or top. Avoid wearing underwire-style bras or extremely tight bras.  Air dry your nipples for 3-4minutes after each feeding.   Use only cotton bra pads to absorb leaked breast milk. Leaking of breast milk between feedings is normal.   Use lanolin on your nipples after breastfeeding. Lanolin helps to maintain your skin's   normal moisture barrier. If you use pure lanolin, you do not need to wash it off before feeding your baby again. Pure lanolin is not toxic to your baby. You may also hand express a few drops of breast milk and gently massage that milk into your nipples and allow the milk to air dry. In the first few weeks after giving birth, some women experience extremely full breasts (engorgement). Engorgement can make your breasts feel heavy, warm, and tender to the touch.  Engorgement peaks within 3-5 days after you give birth. The following recommendations can help ease engorgement:  Completely empty your breasts while breastfeeding or pumping. You may want to start by applying warm, moist heat (in the shower or with warm water-soaked hand towels) just before feeding or pumping. This increases circulation and helps the milk flow. If your baby does not completely empty your breasts while breastfeeding, pump any extra milk after he or she is finished.  Wear a snug bra (nursing or regular) or tank top for 1-2 days to signal your body to slightly decrease milk production.  Apply ice packs to your breasts, unless this is too uncomfortable for you.  Make sure that your baby is latched on and positioned properly while breastfeeding. If engorgement persists after 48 hours of following these recommendations, contact your health care provider or a lactation consultant. OVERALL HEALTH CARE RECOMMENDATIONS WHILE BREASTFEEDING  Eat healthy foods. Alternate between meals and snacks, eating 3 of each per day. Because what you eat affects your breast milk, some of the foods may make your baby more irritable than usual. Avoid eating these foods if you are sure that they are negatively affecting your baby.  Drink milk, fruit juice, and water to satisfy your thirst (about 10 glasses a day).   Rest often, relax, and continue to take your prenatal vitamins to prevent fatigue, stress, and anemia.  Continue breast self-awareness checks.  Avoid chewing and smoking tobacco.  Avoid alcohol and drug use. Some medicines that may be harmful to your baby can pass through breast milk. It is important to ask your health care provider before taking any medicine, including all over-the-counter and prescription medicine as well as vitamin and herbal supplements. It is possible to become pregnant while breastfeeding. If birth control is desired, ask your health care provider about options that  will be safe for your baby. SEEK MEDICAL CARE IF:   You feel like you want to stop breastfeeding or have become frustrated with breastfeeding.  You have painful breasts or nipples.  Your nipples are cracked or bleeding.  Your breasts are red, tender, or warm.  You have a swollen area on either breast.  You have a fever or chills.  You have nausea or vomiting.  You have drainage other than breast milk from your nipples.  Your breasts do not become full before feedings by the fifth day after you give birth.  You feel sad and depressed.  Your baby is too sleepy to eat well.  Your baby is having trouble sleeping.   Your baby is wetting less than 3 diapers in a 24-hour period.  Your baby has less than 3 stools in a 24-hour period.  Your baby's skin or the white part of his or her eyes becomes yellow.   Your baby is not gaining weight by 5 days of age. SEEK IMMEDIATE MEDICAL CARE IF:   Your baby is overly tired (lethargic) and does not want to wake up and feed.  Your baby   develops an unexplained fever. Document Released: 05/10/2005 Document Revised: 05/15/2013 Document Reviewed: 11/01/2012 [employer] Patient Information 2015 [employer]. This information is not intended to replace advice given to you by your health care provider. Make sure you discuss any questions you have with your health care provider.  

## 2014-02-19 NOTE — Progress Notes (Signed)
BP is stable 28 wk labs and TDaP today U/S for growth for HTN

## 2014-02-20 LAB — GLUCOSE TOLERANCE, 1 HOUR (50G) W/O FASTING: GLUCOSE 1 HOUR GTT: 125 mg/dL (ref 70–140)

## 2014-02-20 LAB — HIV ANTIBODY (ROUTINE TESTING W REFLEX): HIV: NONREACTIVE

## 2014-02-20 LAB — CBC
HCT: 33.9 % — ABNORMAL LOW (ref 36.0–46.0)
Hemoglobin: 11.2 g/dL — ABNORMAL LOW (ref 12.0–15.0)
MCH: 27.1 pg (ref 26.0–34.0)
MCHC: 33 g/dL (ref 30.0–36.0)
MCV: 82.1 fL (ref 78.0–100.0)
PLATELETS: 286 10*3/uL (ref 150–400)
RBC: 4.13 MIL/uL (ref 3.87–5.11)
RDW: 13.4 % (ref 11.5–15.5)
WBC: 10.1 10*3/uL (ref 4.0–10.5)

## 2014-02-20 LAB — RPR

## 2014-02-26 ENCOUNTER — Ambulatory Visit (HOSPITAL_COMMUNITY)
Admission: RE | Admit: 2014-02-26 | Discharge: 2014-02-26 | Disposition: A | Discharge status: Home / Self Care | Payer: BC Managed Care – PPO | Source: Ambulatory Visit | Attending: Family Medicine | Admitting: Family Medicine

## 2014-02-26 ENCOUNTER — Other Ambulatory Visit: Payer: Self-pay | Admitting: Family Medicine

## 2014-02-26 ENCOUNTER — Encounter: Payer: Self-pay | Admitting: Family Medicine

## 2014-02-26 DIAGNOSIS — O403XX0 Polyhydramnios, third trimester, not applicable or unspecified: Secondary | ICD-10-CM | POA: Insufficient documentation

## 2014-02-26 DIAGNOSIS — O10019 Pre-existing essential hypertension complicating pregnancy, unspecified trimester: Secondary | ICD-10-CM

## 2014-02-26 DIAGNOSIS — O403XX1 Polyhydramnios, third trimester, fetus 1: Secondary | ICD-10-CM

## 2014-02-26 DIAGNOSIS — O10013 Pre-existing essential hypertension complicating pregnancy, third trimester: Secondary | ICD-10-CM | POA: Diagnosis not present

## 2014-02-26 DIAGNOSIS — Z3A29 29 weeks gestation of pregnancy: Secondary | ICD-10-CM

## 2014-02-26 DIAGNOSIS — Z3A00 Weeks of gestation of pregnancy not specified: Secondary | ICD-10-CM | POA: Diagnosis not present

## 2014-02-26 DIAGNOSIS — O09293 Supervision of pregnancy with other poor reproductive or obstetric history, third trimester: Secondary | ICD-10-CM | POA: Diagnosis not present

## 2014-02-27 ENCOUNTER — Encounter: Payer: BC Managed Care – PPO | Attending: Obstetrics & Gynecology

## 2014-02-27 VITALS — Ht 66.0 in | Wt 275.1 lb

## 2014-02-27 DIAGNOSIS — O24410 Gestational diabetes mellitus in pregnancy, diet controlled: Secondary | ICD-10-CM | POA: Diagnosis not present

## 2014-02-27 DIAGNOSIS — Z713 Dietary counseling and surveillance: Secondary | ICD-10-CM | POA: Insufficient documentation

## 2014-02-28 ENCOUNTER — Telehealth: Payer: Self-pay | Admitting: *Deleted

## 2014-02-28 DIAGNOSIS — O402XX1 Polyhydramnios, second trimester, fetus 1: Secondary | ICD-10-CM

## 2014-02-28 NOTE — Telephone Encounter (Signed)
Message copied by Barbara CowerNOGUES, Ramata Strothman L on Thu Feb 28, 2014  3:20 PM ------      Message from: Reva BoresPRATT, TANYA S      Created: Tue Feb 26, 2014  4:52 PM       Has poly--unsure as to why--all looks normal--will need weekly BPP with MFM until begins 2x/wk testing starting at 32 wks. ------

## 2014-02-28 NOTE — Telephone Encounter (Signed)
Spoke with patient and put orders in for BPP, patient will call to set the appointment up.

## 2014-03-01 ENCOUNTER — Other Ambulatory Visit (HOSPITAL_COMMUNITY): Payer: Self-pay | Admitting: Obstetrics and Gynecology

## 2014-03-01 DIAGNOSIS — O10013 Pre-existing essential hypertension complicating pregnancy, third trimester: Secondary | ICD-10-CM

## 2014-03-01 DIAGNOSIS — O403XX1 Polyhydramnios, third trimester, fetus 1: Secondary | ICD-10-CM

## 2014-03-01 DIAGNOSIS — O09293 Supervision of pregnancy with other poor reproductive or obstetric history, third trimester: Secondary | ICD-10-CM

## 2014-03-05 NOTE — Progress Notes (Signed)
  Patient was seen on 02/27/14 for Gestational Diabetes self-management . The following learning objectives were met by the patient :   States the definition of Gestational Diabetes  States why dietary management is important in controlling blood glucose  Describes the effects of carbohydrates on blood glucose levels  Demonstrates ability to create a balanced meal plan  Demonstrates carbohydrate counting   States when to check blood glucose levels  Demonstrates proper blood glucose monitoring techniques  States the effect of stress and exercise on blood glucose levels  States the importance of limiting caffeine and abstaining from alcohol and smoking  Plan:  Aim for 2 Carb Choices per meal (30 grams) +/- 1 either way for breakfast Aim for 3 Carb Choices per meal (45 grams) +/- 1 either way from lunch and dinner Aim for 1-2 Carbs per snack Begin reading food labels for Total Carbohydrate and sugar grams of foods Consider  increasing your activity level by walking daily as tolerated Begin checking BG before breakfast and 2 hours after first bit of breakfast, lunch and dinner after  as directed by MD  Take medication  as directed by MD  Patient received the following handouts:  Nutrition Diabetes and Pregnancy  Carbohydrate Counting List  Meal Planning worksheet  Patient will be seen for follow-up as needed.

## 2014-03-06 ENCOUNTER — Ambulatory Visit (HOSPITAL_COMMUNITY)
Admission: RE | Admit: 2014-03-06 | Discharge: 2014-03-06 | Disposition: A | Discharge status: Home / Self Care | Payer: BC Managed Care – PPO | Source: Ambulatory Visit | Attending: Family Medicine | Admitting: Family Medicine

## 2014-03-06 ENCOUNTER — Encounter (HOSPITAL_COMMUNITY): Payer: Self-pay

## 2014-03-06 VITALS — BP 148/81 | HR 91 | Wt 277.0 lb

## 2014-03-06 DIAGNOSIS — O403XX0 Polyhydramnios, third trimester, not applicable or unspecified: Secondary | ICD-10-CM

## 2014-03-06 DIAGNOSIS — O10013 Pre-existing essential hypertension complicating pregnancy, third trimester: Secondary | ICD-10-CM | POA: Diagnosis not present

## 2014-03-06 DIAGNOSIS — Z3A30 30 weeks gestation of pregnancy: Secondary | ICD-10-CM | POA: Diagnosis not present

## 2014-03-06 DIAGNOSIS — O09293 Supervision of pregnancy with other poor reproductive or obstetric history, third trimester: Secondary | ICD-10-CM

## 2014-03-06 DIAGNOSIS — O403XX1 Polyhydramnios, third trimester, fetus 1: Secondary | ICD-10-CM

## 2014-03-12 ENCOUNTER — Ambulatory Visit (INDEPENDENT_AMBULATORY_CARE_PROVIDER_SITE_OTHER): Payer: BC Managed Care – PPO | Admitting: Family Medicine

## 2014-03-12 ENCOUNTER — Encounter: Payer: Self-pay | Admitting: Family Medicine

## 2014-03-12 VITALS — BP 134/96 | HR 109 | Wt 277.0 lb

## 2014-03-12 DIAGNOSIS — O10019 Pre-existing essential hypertension complicating pregnancy, unspecified trimester: Secondary | ICD-10-CM

## 2014-03-12 DIAGNOSIS — Z3483 Encounter for supervision of other normal pregnancy, third trimester: Secondary | ICD-10-CM

## 2014-03-12 DIAGNOSIS — O403XX1 Polyhydramnios, third trimester, fetus 1: Secondary | ICD-10-CM

## 2014-03-12 NOTE — Progress Notes (Signed)
U/s has shown mild poly 26-29 AFI Growth is 64%--f/u in 4 wks BP ok--to begin 2x/wk testing next week.

## 2014-03-12 NOTE — Patient Instructions (Signed)
Preeclampsia and Eclampsia Preeclampsia is a serious condition that develops only during pregnancy. It is also called toxemia of pregnancy. This condition causes high blood pressure along with other symptoms, such as swelling and headaches. These may develop as the condition gets worse. Preeclampsia may occur 20 weeks or later into your pregnancy.  Diagnosing and treating preeclampsia early is very important. If not treated early, it can cause serious problems for you and your baby. One problem it can lead to is eclampsia, which is a condition that causes muscle jerking or shaking (convulsions) in the mother. Delivering your baby is the best treatment for preeclampsia or eclampsia.  RISK FACTORS The cause of preeclampsia is not known. You may be more likely to develop preeclampsia if you have certain risk factors. These include:   Being pregnant for the first time.  Having preeclampsia in a past pregnancy.  Having a family history of preeclampsia.  Having high blood pressure.  Being pregnant with twins or triplets.  Being 35 or older.  Being African American.  Having kidney disease or diabetes.  Having medical conditions such as lupus or blood diseases.  Being very overweight (obese). SIGNS AND SYMPTOMS  The earliest signs of preeclampsia are:  High blood pressure.  Increased protein in your urine. Your health care provider will check for this at every prenatal visit. Other symptoms that can develop include:   Severe headaches.  Sudden weight gain.  Swelling of your hands, face, legs, and feet.  Feeling sick to your stomach (nauseous) and throwing up (vomiting).  Vision problems (blurred or double vision).  Numbness in your face, arms, legs, and feet.  Dizziness.  Slurred speech.  Sensitivity to bright lights.  Abdominal pain. DIAGNOSIS  There are no screening tests for preeclampsia. Your health care provider will ask you about symptoms and check for signs of  preeclampsia during your prenatal visits. You may also have tests, including:  Urine testing.  Blood testing.  Checking your baby's heart rate.  Checking the health of your baby and your placenta using images created with sound waves (ultrasound). TREATMENT  You can work out the best treatment approach together with your health care provider. It is very important to keep all prenatal appointments. If you have an increased risk of preeclampsia, you may need more frequent prenatal exams.  Your health care provider may prescribe bed rest.  You may have to eat as little salt as possible.  You may need to take medicine to lower your blood pressure if the condition does not respond to more conservative measures.  You may need to stay in the hospital if your condition is severe. There, treatment will focus on controlling your blood pressure and fluid retention. You may also need to take medicine to prevent seizures.  If the condition gets worse, your baby may need to be delivered early to protect you and the baby. You may have your labor started with medicine (be induced), or you may have a cesarean delivery.  Preeclampsia usually goes away after the baby is born. HOME CARE INSTRUCTIONS   Only take over-the-counter or prescription medicines as directed by your health care provider.  Lie on your left side while resting. This keeps pressure off your baby.  Elevate your feet while resting.  Get regular exercise. Ask your health care provider what type of exercise is safe for you.  Avoid caffeine and alcohol.  Do not smoke.  Drink 6-8 glasses of water every day.  Eat a balanced diet   that is low in salt. Do not add salt to your food.  Avoid stressful situations as much as possible.  Get plenty of rest and sleep.  Keep all prenatal appointments and tests as scheduled. SEEK MEDICAL CARE IF:  You are gaining more weight than expected.  You have any headaches, abdominal pain, or  nausea.  You are bruising more than usual.  You feel dizzy or light-headed. SEEK IMMEDIATE MEDICAL CARE IF:   You develop sudden or severe swelling anywhere in your body. This usually happens in the legs.  You gain 5 lb (2.3 kg) or more in a week.  You have a severe headache, dizziness, problems with your vision, or confusion.  You have severe abdominal pain.  You have lasting nausea or vomiting.  You have a seizure.  You have trouble moving any part of your body.  You develop numbness in your body.  You have trouble speaking.  You have any abnormal bleeding.  You develop a stiff neck.  You pass out. MAKE SURE YOU:   Understand these instructions.  Will watch your condition.  Will get help right away if you are not doing well or get worse. Document Released: 05/07/2000 Document Revised: 05/15/2013 Document Reviewed: 03/02/2013 [employer] Patient Information 2015 [employer]. This information is not intended to replace advice given to you by your health care provider. Make sure you discuss any questions you have with your health care provider.  Third Trimester of Pregnancy The third trimester is from week 29 through week 42, months 7 through 9. The third trimester is a time when the fetus is growing rapidly. At the end of the ninth month, the fetus is about 20 inches in length and weighs 6-10 pounds.  BODY CHANGES Your body goes through many changes during pregnancy. The changes vary from woman to woman.   Your weight will continue to increase. You can expect to gain 25-35 pounds (11-16 kg) by the end of the pregnancy.  You may begin to get stretch marks on your hips, abdomen, and breasts.  You may urinate more often because the fetus is moving lower into your pelvis and pressing on your bladder.  You may develop or continue to have heartburn as a result of your pregnancy.  You may develop constipation because certain hormones are causing the muscles that push  waste through your intestines to slow down.  You may develop hemorrhoids or swollen, bulging veins (varicose veins).  You may have pelvic pain because of the weight gain and pregnancy hormones relaxing your joints between the bones in your pelvis. Backaches may result from overexertion of the muscles supporting your posture.  You may have changes in your hair. These can include thickening of your hair, rapid growth, and changes in texture. Some women also have hair loss during or after pregnancy, or hair that feels dry or thin. Your hair will most likely return to normal after your baby is born.  Your breasts will continue to grow and be tender. A yellow discharge may leak from your breasts called colostrum.  Your belly button may stick out.  You may feel short of breath because of your expanding uterus.  You may notice the fetus "dropping," or moving lower in your abdomen.  You may have a bloody mucus discharge. This usually occurs a few days to a week before labor begins.  Your cervix becomes thin and soft (effaced) near your due date. WHAT TO EXPECT AT YOUR PRENATAL EXAMS  You will have   prenatal exams every 2 weeks until week 36. Then, you will have weekly prenatal exams. During a routine prenatal visit:  You will be weighed to make sure you and the fetus are growing normally.  Your blood pressure is taken.  Your abdomen will be measured to track your baby's growth.  The fetal heartbeat will be listened to.  Any test results from the previous visit will be discussed.  You may have a cervical check near your due date to see if you have effaced. At around 36 weeks, your caregiver will check your cervix. At the same time, your caregiver will also perform a test on the secretions of the vaginal tissue. This test is to determine if a type of bacteria, Group B streptococcus, is present. Your caregiver will explain this further. Your caregiver may ask you:  What your birth plan  is.  How you are feeling.  If you are feeling the baby move.  If you have had any abnormal symptoms, such as leaking fluid, bleeding, severe headaches, or abdominal cramping.  If you have any questions. Other tests or screenings that may be performed during your third trimester include:  Blood tests that check for low iron levels (anemia).  Fetal testing to check the health, activity level, and growth of the fetus. Testing is done if you have certain medical conditions or if there are problems during the pregnancy. FALSE LABOR You may feel small, irregular contractions that eventually go away. These are called Braxton Hicks contractions, or false labor. Contractions may last for hours, days, or even weeks before true labor sets in. If contractions come at regular intervals, intensify, or become painful, it is best to be seen by your caregiver.  SIGNS OF LABOR   Menstrual-like cramps.  Contractions that are 5 minutes apart or less.  Contractions that start on the top of the uterus and spread down to the lower abdomen and back.  A sense of increased pelvic pressure or back pain.  A watery or bloody mucus discharge that comes from the vagina. If you have any of these signs before the 37th week of pregnancy, call your caregiver right away. You need to go to the hospital to get checked immediately. HOME CARE INSTRUCTIONS   Avoid all smoking, herbs, alcohol, and unprescribed drugs. These chemicals affect the formation and growth of the baby.  Follow your caregiver's instructions regarding medicine use. There are medicines that are either safe or unsafe to take during pregnancy.  Exercise only as directed by your caregiver. Experiencing uterine cramps is a good sign to stop exercising.  Continue to eat regular, healthy meals.  Wear a good support bra for breast tenderness.  Do not use hot tubs, steam rooms, or saunas.  Wear your seat belt at all times when driving.  Avoid raw  meat, uncooked cheese, cat litter boxes, and soil used by cats. These carry germs that can cause birth defects in the baby.  Take your prenatal vitamins.  Try taking a stool softener (if your caregiver approves) if you develop constipation. Eat more high-fiber foods, such as fresh vegetables or fruit and whole grains. Drink plenty of fluids to keep your urine clear or pale yellow.  Take warm sitz baths to soothe any pain or discomfort caused by hemorrhoids. Use hemorrhoid cream if your caregiver approves.  If you develop varicose veins, wear support hose. Elevate your feet for 15 minutes, 3-4 times a day. Limit salt in your diet.  Avoid heavy lifting, wear low   heal shoes, and practice good posture.  Rest a lot with your legs elevated if you have leg cramps or low back pain.  Visit your dentist if you have not gone during your pregnancy. Use a soft toothbrush to brush your teeth and be gentle when you floss.  A sexual relationship may be continued unless your caregiver directs you otherwise.  Do not travel far distances unless it is absolutely necessary and only with the approval of your caregiver.  Take prenatal classes to understand, practice, and ask questions about the labor and delivery.  Make a trial run to the hospital.  Pack your hospital bag.  Prepare the baby's nursery.  Continue to go to all your prenatal visits as directed by your caregiver. SEEK MEDICAL CARE IF:  You are unsure if you are in labor or if your water has broken.  You have dizziness.  You have mild pelvic cramps, pelvic pressure, or nagging pain in your abdominal area.  You have persistent nausea, vomiting, or diarrhea.  You have a bad smelling vaginal discharge.  You have pain with urination. SEEK IMMEDIATE MEDICAL CARE IF:   You have a fever.  You are leaking fluid from your vagina.  You have spotting or bleeding from your vagina.  You have severe abdominal cramping or pain.  You have  rapid weight loss or gain.  You have shortness of breath with chest pain.  You notice sudden or extreme swelling of your face, hands, ankles, feet, or legs.  You have not felt your baby move in over an hour.  You have severe headaches that do not go away with medicine.  You have vision changes. Document Released: 05/04/2001 Document Revised: 05/15/2013 Document Reviewed: 07/11/2012 [employer] Patient Information 2015 [employer]. This information is not intended to replace advice given to you by your health care provider. Make sure you discuss any questions you have with your health care provider.  Breastfeeding Deciding to breastfeed is one of the best choices you can make for you and your baby. A change in hormones during pregnancy causes your breast tissue to grow and increases the number and size of your milk ducts. These hormones also allow proteins, sugars, and fats from your blood supply to make breast milk in your milk-producing glands. Hormones prevent breast milk from being released before your baby is born as well as prompt milk flow after birth. Once breastfeeding has begun, thoughts of your baby, as well as his or her sucking or crying, can stimulate the release of milk from your milk-producing glands.  BENEFITS OF BREASTFEEDING For Your Baby  Your first milk (colostrum) helps your baby's digestive system function better.   There are antibodies in your milk that help your baby fight off infections.   Your baby has a lower incidence of asthma, allergies, and sudden infant death syndrome.   The nutrients in breast milk are better for your baby than infant formulas and are designed uniquely for your baby's needs.   Breast milk improves your baby's brain development.   Your baby is less likely to develop other conditions, such as childhood obesity, asthma, or type 2 diabetes mellitus.  For You   Breastfeeding helps to create a very special bond between you and your  baby.   Breastfeeding is convenient. Breast milk is always available at the correct temperature and costs nothing.   Breastfeeding helps to burn calories and helps you lose the weight gained during pregnancy.   Breastfeeding makes your uterus contract   to its prepregnancy size faster and slows bleeding (lochia) after you give birth.   Breastfeeding helps to lower your risk of developing type 2 diabetes mellitus, osteoporosis, and breast or ovarian cancer later in life. SIGNS THAT YOUR BABY IS HUNGRY Early Signs of Hunger  Increased alertness or activity.  Stretching.  Movement of the head from side to side.  Movement of the head and opening of the mouth when the corner of the mouth or cheek is stroked (rooting).  Increased sucking sounds, smacking lips, cooing, sighing, or squeaking.  Hand-to-mouth movements.  Increased sucking of fingers or hands. Late Signs of Hunger  Fussing.  Intermittent crying. Extreme Signs of Hunger Signs of extreme hunger will require calming and consoling before your baby will be able to breastfeed successfully. Do not wait for the following signs of extreme hunger to occur before you initiate breastfeeding:   Restlessness.  A loud, strong cry.   Screaming. BREASTFEEDING BASICS Breastfeeding Initiation  Find a comfortable place to sit or lie down, with your neck and back well supported.  Place a pillow or rolled up blanket under your baby to bring him or her to the level of your breast (if you are seated). Nursing pillows are specially designed to help support your arms and your baby while you breastfeed.  Make sure that your baby's abdomen is facing your abdomen.   Gently massage your breast. With your fingertips, massage from your chest wall toward your nipple in a circular motion. This encourages milk flow. You may need to continue this action during the feeding if your milk flows slowly.  Support your breast with 4 fingers  underneath and your thumb above your nipple. Make sure your fingers are well away from your nipple and your baby's mouth.   Stroke your baby's lips gently with your finger or nipple.   When your baby's mouth is open wide enough, quickly bring your baby to your breast, placing your entire nipple and as much of the colored area around your nipple (areola) as possible into your baby's mouth.   More areola should be visible above your baby's upper lip than below the lower lip.   Your baby's tongue should be between his or her lower gum and your breast.   Ensure that your baby's mouth is correctly positioned around your nipple (latched). Your baby's lips should create a seal on your breast and be turned out (everted).  It is common for your baby to suck about 2-3 minutes in order to start the flow of breast milk. Latching Teaching your baby how to latch on to your breast properly is very important. An improper latch can cause nipple pain and decreased milk supply for you and poor weight gain in your baby. Also, if your baby is not latched onto your nipple properly, he or she may swallow some air during feeding. This can make your baby fussy. Burping your baby when you switch breasts during the feeding can help to get rid of the air. However, teaching your baby to latch on properly is still the best way to prevent fussiness from swallowing air while breastfeeding. Signs that your baby has successfully latched on to your nipple:    Silent tugging or silent sucking, without causing you pain.   Swallowing heard between every 3-4 sucks.    Muscle movement above and in front of his or her ears while sucking.  Signs that your baby has not successfully latched on to nipple:   Sucking sounds   or smacking sounds from your baby while breastfeeding.  Nipple pain. If you think your baby has not latched on correctly, slip your finger into the corner of your baby's mouth to break the suction and place  it between your baby's gums. Attempt breastfeeding initiation again. Signs of Successful Breastfeeding Signs from your baby:   A gradual decrease in the number of sucks or complete cessation of sucking.   Falling asleep.   Relaxation of his or her body.   Retention of a small amount of milk in his or her mouth.   Letting go of your breast by himself or herself. Signs from you:  Breasts that have increased in firmness, weight, and size 1-3 hours after feeding.   Breasts that are softer immediately after breastfeeding.  Increased milk volume, as well as a change in milk consistency and color by the fifth day of breastfeeding.   Nipples that are not sore, cracked, or bleeding. Signs That Your Baby is Getting Enough Milk  Wetting at least 3 diapers in a 24-hour period. The urine should be clear and pale yellow by age 5 days.  At least 3 stools in a 24-hour period by age 5 days. The stool should be soft and yellow.  At least 3 stools in a 24-hour period by age 7 days. The stool should be seedy and yellow.  No loss of weight greater than 10% of birth weight during the first 3 days of age.  Average weight gain of 4-7 ounces (113-198 g) per week after age 4 days.  Consistent daily weight gain by age 5 days, without weight loss after the age of 2 weeks. After a feeding, your baby may spit up a small amount. This is common. BREASTFEEDING FREQUENCY AND DURATION Frequent feeding will help you make more milk and can prevent sore nipples and breast engorgement. Breastfeed when you feel the need to reduce the fullness of your breasts or when your baby shows signs of hunger. This is called "breastfeeding on demand." Avoid introducing a pacifier to your baby while you are working to establish breastfeeding (the first 4-6 weeks after your baby is born). After this time you may choose to use a pacifier. Research has shown that pacifier use during the first year of a baby's life decreases the  risk of sudden infant death syndrome (SIDS). Allow your baby to feed on each breast as long as he or she wants. Breastfeed until your baby is finished feeding. When your baby unlatches or falls asleep while feeding from the first breast, offer the second breast. Because newborns are often sleepy in the first few weeks of life, you may need to awaken your baby to get him or her to feed. Breastfeeding times will vary from baby to baby. However, the following rules can serve as a guide to help you ensure that your baby is properly fed:  Newborns (babies 4 weeks of age or younger) may breastfeed every 1-3 hours.  Newborns should not go longer than 3 hours during the day or 5 hours during the night without breastfeeding.  You should breastfeed your baby a minimum of 8 times in a 24-hour period until you begin to introduce solid foods to your baby at around 6 months of age. BREAST MILK PUMPING Pumping and storing breast milk allows you to ensure that your baby is exclusively fed your breast milk, even at times when you are unable to breastfeed. This is especially important if you are going back to work   while you are still breastfeeding or when you are not able to be present during feedings. Your lactation consultant can give you guidelines on how long it is safe to store breast milk.  A breast pump is a machine that allows you to pump milk from your breast into a sterile bottle. The pumped breast milk can then be stored in a refrigerator or freezer. Some breast pumps are operated by hand, while others use electricity. Ask your lactation consultant which type will work best for you. Breast pumps can be purchased, but some hospitals and breastfeeding support groups lease breast pumps on a monthly basis. A lactation consultant can teach you how to hand express breast milk, if you prefer not to use a pump.  CARING FOR YOUR BREASTS WHILE YOU BREASTFEED Nipples can become dry, cracked, and sore while breastfeeding.  The following recommendations can help keep your breasts moisturized and healthy:  Avoid using soap on your nipples.   Wear a supportive bra. Although not required, special nursing bras and tank tops are designed to allow access to your breasts for breastfeeding without taking off your entire bra or top. Avoid wearing underwire-style bras or extremely tight bras.  Air dry your nipples for 3-4minutes after each feeding.   Use only cotton bra pads to absorb leaked breast milk. Leaking of breast milk between feedings is normal.   Use lanolin on your nipples after breastfeeding. Lanolin helps to maintain your skin's normal moisture barrier. If you use pure lanolin, you do not need to wash it off before feeding your baby again. Pure lanolin is not toxic to your baby. You may also hand express a few drops of breast milk and gently massage that milk into your nipples and allow the milk to air dry. In the first few weeks after giving birth, some women experience extremely full breasts (engorgement). Engorgement can make your breasts feel heavy, warm, and tender to the touch. Engorgement peaks within 3-5 days after you give birth. The following recommendations can help ease engorgement:  Completely empty your breasts while breastfeeding or pumping. You may want to start by applying warm, moist heat (in the shower or with warm water-soaked hand towels) just before feeding or pumping. This increases circulation and helps the milk flow. If your baby does not completely empty your breasts while breastfeeding, pump any extra milk after he or she is finished.  Wear a snug bra (nursing or regular) or tank top for 1-2 days to signal your body to slightly decrease milk production.  Apply ice packs to your breasts, unless this is too uncomfortable for you.  Make sure that your baby is latched on and positioned properly while breastfeeding. If engorgement persists after 48 hours of following these  recommendations, contact your health care provider or a lactation consultant. OVERALL HEALTH CARE RECOMMENDATIONS WHILE BREASTFEEDING  Eat healthy foods. Alternate between meals and snacks, eating 3 of each per day. Because what you eat affects your breast milk, some of the foods may make your baby more irritable than usual. Avoid eating these foods if you are sure that they are negatively affecting your baby.  Drink milk, fruit juice, and water to satisfy your thirst (about 10 glasses a day).   Rest often, relax, and continue to take your prenatal vitamins to prevent fatigue, stress, and anemia.  Continue breast self-awareness checks.  Avoid chewing and smoking tobacco.  Avoid alcohol and drug use. Some medicines that may be harmful to your baby can pass through   breast milk. It is important to ask your health care provider before taking any medicine, including all over-the-counter and prescription medicine as well as vitamin and herbal supplements. It is possible to become pregnant while breastfeeding. If birth control is desired, ask your health care provider about options that will be safe for your baby. SEEK MEDICAL CARE IF:   You feel like you want to stop breastfeeding or have become frustrated with breastfeeding.  You have painful breasts or nipples.  Your nipples are cracked or bleeding.  Your breasts are red, tender, or warm.  You have a swollen area on either breast.  You have a fever or chills.  You have nausea or vomiting.  You have drainage other than breast milk from your nipples.  Your breasts do not become full before feedings by the fifth day after you give birth.  You feel sad and depressed.  Your baby is too sleepy to eat well.  Your baby is having trouble sleeping.   Your baby is wetting less than 3 diapers in a 24-hour period.  Your baby has less than 3 stools in a 24-hour period.  Your baby's skin or the white part of his or her eyes becomes  yellow.   Your baby is not gaining weight by 5 days of age. SEEK IMMEDIATE MEDICAL CARE IF:   Your baby is overly tired (lethargic) and does not want to wake up and feed.  Your baby develops an unexplained fever. Document Released: 05/10/2005 Document Revised: 05/15/2013 Document Reviewed: 11/01/2012 Exit[employer] Patient Information 2015 [employer]. This information is not intended to replace advice given to you by your health care provider. Make sure you discuss any questions you have with your health care provider.  

## 2014-03-13 ENCOUNTER — Ambulatory Visit (HOSPITAL_COMMUNITY)
Admission: RE | Admit: 2014-03-13 | Discharge: 2014-03-13 | Disposition: A | Discharge status: Home / Self Care | Payer: BC Managed Care – PPO | Source: Ambulatory Visit | Attending: Family Medicine | Admitting: Family Medicine

## 2014-03-13 ENCOUNTER — Encounter (HOSPITAL_COMMUNITY): Payer: Self-pay

## 2014-03-13 VITALS — BP 141/79 | HR 90 | Wt 280.0 lb

## 2014-03-13 DIAGNOSIS — O403XX1 Polyhydramnios, third trimester, fetus 1: Secondary | ICD-10-CM

## 2014-03-13 DIAGNOSIS — O403XX0 Polyhydramnios, third trimester, not applicable or unspecified: Secondary | ICD-10-CM | POA: Diagnosis not present

## 2014-03-13 DIAGNOSIS — O10013 Pre-existing essential hypertension complicating pregnancy, third trimester: Secondary | ICD-10-CM | POA: Diagnosis not present

## 2014-03-13 DIAGNOSIS — Z3A00 Weeks of gestation of pregnancy not specified: Secondary | ICD-10-CM | POA: Diagnosis not present

## 2014-03-13 DIAGNOSIS — O10019 Pre-existing essential hypertension complicating pregnancy, unspecified trimester: Secondary | ICD-10-CM

## 2014-03-13 DIAGNOSIS — O09293 Supervision of pregnancy with other poor reproductive or obstetric history, third trimester: Secondary | ICD-10-CM

## 2014-03-18 ENCOUNTER — Ambulatory Visit (INDEPENDENT_AMBULATORY_CARE_PROVIDER_SITE_OTHER): Payer: BC Managed Care – PPO | Admitting: Family Medicine

## 2014-03-18 ENCOUNTER — Encounter: Payer: Self-pay | Admitting: Family Medicine

## 2014-03-18 VITALS — BP 137/89 | HR 97 | Wt 278.6 lb

## 2014-03-18 DIAGNOSIS — O10019 Pre-existing essential hypertension complicating pregnancy, unspecified trimester: Secondary | ICD-10-CM

## 2014-03-18 DIAGNOSIS — O403XX1 Polyhydramnios, third trimester, fetus 1: Secondary | ICD-10-CM

## 2014-03-18 DIAGNOSIS — Z3483 Encounter for supervision of other normal pregnancy, third trimester: Secondary | ICD-10-CM

## 2014-03-18 NOTE — Progress Notes (Signed)
Doing well.  BP stable.  Denies contractions. Category 1 tracing with baseline in 130s.  Moderate variability, multiple accelerations, no decelerations.

## 2014-03-20 ENCOUNTER — Ambulatory Visit (HOSPITAL_COMMUNITY): Payer: BC Managed Care – PPO

## 2014-03-21 ENCOUNTER — Other Ambulatory Visit: Payer: Self-pay

## 2014-03-21 ENCOUNTER — Ambulatory Visit (INDEPENDENT_AMBULATORY_CARE_PROVIDER_SITE_OTHER): Payer: BC Managed Care – PPO | Admitting: *Deleted

## 2014-03-21 VITALS — BP 124/82 | HR 108

## 2014-03-21 DIAGNOSIS — O403XX1 Polyhydramnios, third trimester, fetus 1: Secondary | ICD-10-CM | POA: Diagnosis not present

## 2014-03-21 DIAGNOSIS — O10019 Pre-existing essential hypertension complicating pregnancy, unspecified trimester: Secondary | ICD-10-CM | POA: Diagnosis not present

## 2014-03-21 LAB — US OB FOLLOW UP

## 2014-03-21 NOTE — Progress Notes (Signed)
NST & US for growth @ [facility] on 11/5

## 2014-03-22 NOTE — Progress Notes (Signed)
NST reviewed and reactive.  

## 2014-03-25 ENCOUNTER — Ambulatory Visit (INDEPENDENT_AMBULATORY_CARE_PROVIDER_SITE_OTHER): Payer: BC Managed Care – PPO | Admitting: Obstetrics & Gynecology

## 2014-03-25 ENCOUNTER — Encounter: Payer: Self-pay | Admitting: Obstetrics & Gynecology

## 2014-03-25 VITALS — BP 121/88 | HR 122 | Wt 280.0 lb

## 2014-03-25 DIAGNOSIS — O403XX1 Polyhydramnios, third trimester, fetus 1: Secondary | ICD-10-CM

## 2014-03-25 DIAGNOSIS — Z3483 Encounter for supervision of other normal pregnancy, third trimester: Secondary | ICD-10-CM

## 2014-03-25 NOTE — Patient Instructions (Signed)
Third Trimester of Pregnancy The third trimester is from week 29 through week 42, months 7 through 9. The third trimester is a time when the fetus is growing rapidly. At the end of the ninth month, the fetus is about 20 inches in length and weighs 6-10 pounds.  BODY CHANGES Your body goes through many changes during pregnancy. The changes vary from woman to woman.   Your weight will continue to increase. You can expect to gain 25-35 pounds (11-16 kg) by the end of the pregnancy.  You may begin to get stretch marks on your hips, abdomen, and breasts.  You may urinate more often because the fetus is moving lower into your pelvis and pressing on your bladder.  You may develop or continue to have heartburn as a result of your pregnancy.  You may develop constipation because certain hormones are causing the muscles that push waste through your intestines to slow down.  You may develop hemorrhoids or swollen, bulging veins (varicose veins).  You may have pelvic pain because of the weight gain and pregnancy hormones relaxing your joints between the bones in your pelvis. Backaches may result from overexertion of the muscles supporting your posture.  You may have changes in your hair. These can include thickening of your hair, rapid growth, and changes in texture. Some women also have hair loss during or after pregnancy, or hair that feels dry or thin. Your hair will most likely return to normal after your baby is born.  Your breasts will continue to grow and be tender. A yellow discharge may leak from your breasts called colostrum.  Your belly button may stick out.  You may feel short of breath because of your expanding uterus.  You may notice the fetus "dropping," or moving lower in your abdomen.  You may have a bloody mucus discharge. This usually occurs a few days to a week before labor begins.  Your cervix becomes thin and soft (effaced) near your due date. WHAT TO EXPECT AT YOUR PRENATAL  EXAMS  You will have prenatal exams every 2 weeks until week 36. Then, you will have weekly prenatal exams. During a routine prenatal visit:  You will be weighed to make sure you and the fetus are growing normally.  Your blood pressure is taken.  Your abdomen will be measured to track your baby's growth.  The fetal heartbeat will be listened to.  Any test results from the previous visit will be discussed.  You may have a cervical check near your due date to see if you have effaced. At around 36 weeks, your caregiver will check your cervix. At the same time, your caregiver will also perform a test on the secretions of the vaginal tissue. This test is to determine if a type of bacteria, Group B streptococcus, is present. Your caregiver will explain this further. Your caregiver may ask you:  What your birth plan is.  How you are feeling.  If you are feeling the baby move.  If you have had any abnormal symptoms, such as leaking fluid, bleeding, severe headaches, or abdominal cramping.  If you have any questions. Other tests or screenings that may be performed during your third trimester include:  Blood tests that check for low iron levels (anemia).  Fetal testing to check the health, activity level, and growth of the fetus. Testing is done if you have certain medical conditions or if there are problems during the pregnancy. FALSE LABOR You may feel small, irregular contractions that   eventually go away. These are called Braxton Hicks contractions, or false labor. Contractions may last for hours, days, or even weeks before true labor sets in. If contractions come at regular intervals, intensify, or become painful, it is best to be seen by your caregiver.  SIGNS OF LABOR   Menstrual-like cramps.  Contractions that are 5 minutes apart or less.  Contractions that start on the top of the uterus and spread down to the lower abdomen and back.  A sense of increased pelvic pressure or back  pain.  A watery or bloody mucus discharge that comes from the vagina. If you have any of these signs before the 37th week of pregnancy, call your caregiver right away. You need to go to the hospital to get checked immediately. HOME CARE INSTRUCTIONS   Avoid all smoking, herbs, alcohol, and unprescribed drugs. These chemicals affect the formation and growth of the baby.  Follow your caregiver's instructions regarding medicine use. There are medicines that are either safe or unsafe to take during pregnancy.  Exercise only as directed by your caregiver. Experiencing uterine cramps is a good sign to stop exercising.  Continue to eat regular, healthy meals.  Wear a good support bra for breast tenderness.  Do not use hot tubs, steam rooms, or saunas.  Wear your seat belt at all times when driving.  Avoid raw meat, uncooked cheese, cat litter boxes, and soil used by cats. These carry germs that can cause birth defects in the baby.  Take your prenatal vitamins.  Try taking a stool softener (if your caregiver approves) if you develop constipation. Eat more high-fiber foods, such as fresh vegetables or fruit and whole grains. Drink plenty of fluids to keep your urine clear or pale yellow.  Take warm sitz baths to soothe any pain or discomfort caused by hemorrhoids. Use hemorrhoid cream if your caregiver approves.  If you develop varicose veins, wear support hose. Elevate your feet for 15 minutes, 3-4 times a day. Limit salt in your diet.  Avoid heavy lifting, wear low heal shoes, and practice good posture.  Rest a lot with your legs elevated if you have leg cramps or low back pain.  Visit your dentist if you have not gone during your pregnancy. Use a soft toothbrush to brush your teeth and be gentle when you floss.  A sexual relationship may be continued unless your caregiver directs you otherwise.  Do not travel far distances unless it is absolutely necessary and only with the approval  of your caregiver.  Take prenatal classes to understand, practice, and ask questions about the labor and delivery.  Make a trial run to the hospital.  Pack your hospital bag.  Prepare the baby's nursery.  Continue to go to all your prenatal visits as directed by your caregiver. SEEK MEDICAL CARE IF:  You are unsure if you are in labor or if your water has broken.  You have dizziness.  You have mild pelvic cramps, pelvic pressure, or nagging pain in your abdominal area.  You have persistent nausea, vomiting, or diarrhea.  You have a bad smelling vaginal discharge.  You have pain with urination. SEEK IMMEDIATE MEDICAL CARE IF:   You have a fever.  You are leaking fluid from your vagina.  You have spotting or bleeding from your vagina.  You have severe abdominal cramping or pain.  You have rapid weight loss or gain.  You have shortness of breath with chest pain.  You notice sudden or extreme swelling   of your face, hands, ankles, feet, or legs.  You have not felt your baby move in over an hour.  You have severe headaches that do not go away with medicine.  You have vision changes. Document Released: 05/04/2001 Document Revised: 05/15/2013 Document Reviewed: 07/11/2012 [employer] Patient Information 2015 [employer]. This information is not intended to replace advice given to you by your health care provider. Make sure you discuss any questions you have with your health care provider.  

## 2014-03-25 NOTE — Progress Notes (Signed)
Pt with no complaints.  +FM, No ctx or LOF; no VB NST reactive Has visit for AFI and growth later this week

## 2014-03-28 ENCOUNTER — Ambulatory Visit (HOSPITAL_COMMUNITY)
Admission: RE | Admit: 2014-03-28 | Discharge: 2014-03-28 | Disposition: A | Discharge status: Home / Self Care | Payer: BC Managed Care – PPO | Source: Ambulatory Visit | Attending: Family Medicine | Admitting: Family Medicine

## 2014-03-28 DIAGNOSIS — O99213 Obesity complicating pregnancy, third trimester: Secondary | ICD-10-CM | POA: Diagnosis not present

## 2014-03-28 DIAGNOSIS — O403XX0 Polyhydramnios, third trimester, not applicable or unspecified: Secondary | ICD-10-CM | POA: Diagnosis present

## 2014-03-28 DIAGNOSIS — O10019 Pre-existing essential hypertension complicating pregnancy, unspecified trimester: Secondary | ICD-10-CM

## 2014-03-28 DIAGNOSIS — O10013 Pre-existing essential hypertension complicating pregnancy, third trimester: Secondary | ICD-10-CM | POA: Insufficient documentation

## 2014-03-28 DIAGNOSIS — Z3A33 33 weeks gestation of pregnancy: Secondary | ICD-10-CM | POA: Diagnosis not present

## 2014-04-01 ENCOUNTER — Encounter: Payer: Self-pay | Admitting: Obstetrics & Gynecology

## 2014-04-01 ENCOUNTER — Ambulatory Visit (INDEPENDENT_AMBULATORY_CARE_PROVIDER_SITE_OTHER): Payer: BC Managed Care – PPO | Admitting: Obstetrics & Gynecology

## 2014-04-01 VITALS — BP 134/88 | HR 116 | Wt 279.8 lb

## 2014-04-01 DIAGNOSIS — Z3483 Encounter for supervision of other normal pregnancy, third trimester: Secondary | ICD-10-CM

## 2014-04-01 DIAGNOSIS — O403XX1 Polyhydramnios, third trimester, fetus 1: Secondary | ICD-10-CM | POA: Diagnosis not present

## 2014-04-01 NOTE — Patient Instructions (Signed)
Third Trimester of Pregnancy The third trimester is from week 29 through week 42, months 7 through 9. The third trimester is a time when the fetus is growing rapidly. At the end of the ninth month, the fetus is about 20 inches in length and weighs 6-10 pounds.  BODY CHANGES Your body goes through many changes during pregnancy. The changes vary from woman to woman.   Your weight will continue to increase. You can expect to gain 25-35 pounds (11-16 kg) by the end of the pregnancy.  You may begin to get stretch marks on your hips, abdomen, and breasts.  You may urinate more often because the fetus is moving lower into your pelvis and pressing on your bladder.  You may develop or continue to have heartburn as a result of your pregnancy.  You may develop constipation because certain hormones are causing the muscles that push waste through your intestines to slow down.  You may develop hemorrhoids or swollen, bulging veins (varicose veins).  You may have pelvic pain because of the weight gain and pregnancy hormones relaxing your joints between the bones in your pelvis. Backaches may result from overexertion of the muscles supporting your posture.  You may have changes in your hair. These can include thickening of your hair, rapid growth, and changes in texture. Some women also have hair loss during or after pregnancy, or hair that feels dry or thin. Your hair will most likely return to normal after your baby is born.  Your breasts will continue to grow and be tender. A yellow discharge may leak from your breasts called colostrum.  Your belly button may stick out.  You may feel short of breath because of your expanding uterus.  You may notice the fetus "dropping," or moving lower in your abdomen.  You may have a bloody mucus discharge. This usually occurs a few days to a week before labor begins.  Your cervix becomes thin and soft (effaced) near your due date. WHAT TO EXPECT AT YOUR PRENATAL  EXAMS  You will have prenatal exams every 2 weeks until week 36. Then, you will have weekly prenatal exams. During a routine prenatal visit:  You will be weighed to make sure you and the fetus are growing normally.  Your blood pressure is taken.  Your abdomen will be measured to track your baby's growth.  The fetal heartbeat will be listened to.  Any test results from the previous visit will be discussed.  You may have a cervical check near your due date to see if you have effaced. At around 36 weeks, your caregiver will check your cervix. At the same time, your caregiver will also perform a test on the secretions of the vaginal tissue. This test is to determine if a type of bacteria, Group B streptococcus, is present. Your caregiver will explain this further. Your caregiver may ask you:  What your birth plan is.  How you are feeling.  If you are feeling the baby move.  If you have had any abnormal symptoms, such as leaking fluid, bleeding, severe headaches, or abdominal cramping.  If you have any questions. Other tests or screenings that may be performed during your third trimester include:  Blood tests that check for low iron levels (anemia).  Fetal testing to check the health, activity level, and growth of the fetus. Testing is done if you have certain medical conditions or if there are problems during the pregnancy. FALSE LABOR You may feel small, irregular contractions that   eventually go away. These are called Braxton Hicks contractions, or false labor. Contractions may last for hours, days, or even weeks before true labor sets in. If contractions come at regular intervals, intensify, or become painful, it is best to be seen by your caregiver.  SIGNS OF LABOR   Menstrual-like cramps.  Contractions that are 5 minutes apart or less.  Contractions that start on the top of the uterus and spread down to the lower abdomen and back.  A sense of increased pelvic pressure or back  pain.  A watery or bloody mucus discharge that comes from the vagina. If you have any of these signs before the 37th week of pregnancy, call your caregiver right away. You need to go to the hospital to get checked immediately. HOME CARE INSTRUCTIONS   Avoid all smoking, herbs, alcohol, and unprescribed drugs. These chemicals affect the formation and growth of the baby.  Follow your caregiver's instructions regarding medicine use. There are medicines that are either safe or unsafe to take during pregnancy.  Exercise only as directed by your caregiver. Experiencing uterine cramps is a good sign to stop exercising.  Continue to eat regular, healthy meals.  Wear a good support bra for breast tenderness.  Do not use hot tubs, steam rooms, or saunas.  Wear your seat belt at all times when driving.  Avoid raw meat, uncooked cheese, cat litter boxes, and soil used by cats. These carry germs that can cause birth defects in the baby.  Take your prenatal vitamins.  Try taking a stool softener (if your caregiver approves) if you develop constipation. Eat more high-fiber foods, such as fresh vegetables or fruit and whole grains. Drink plenty of fluids to keep your urine clear or pale yellow.  Take warm sitz baths to soothe any pain or discomfort caused by hemorrhoids. Use hemorrhoid cream if your caregiver approves.  If you develop varicose veins, wear support hose. Elevate your feet for 15 minutes, 3-4 times a day. Limit salt in your diet.  Avoid heavy lifting, wear low heal shoes, and practice good posture.  Rest a lot with your legs elevated if you have leg cramps or low back pain.  Visit your dentist if you have not gone during your pregnancy. Use a soft toothbrush to brush your teeth and be gentle when you floss.  A sexual relationship may be continued unless your caregiver directs you otherwise.  Do not travel far distances unless it is absolutely necessary and only with the approval  of your caregiver.  Take prenatal classes to understand, practice, and ask questions about the labor and delivery.  Make a trial run to the hospital.  Pack your hospital bag.  Prepare the baby's nursery.  Continue to go to all your prenatal visits as directed by your caregiver. SEEK MEDICAL CARE IF:  You are unsure if you are in labor or if your water has broken.  You have dizziness.  You have mild pelvic cramps, pelvic pressure, or nagging pain in your abdominal area.  You have persistent nausea, vomiting, or diarrhea.  You have a bad smelling vaginal discharge.  You have pain with urination. SEEK IMMEDIATE MEDICAL CARE IF:   You have a fever.  You are leaking fluid from your vagina.  You have spotting or bleeding from your vagina.  You have severe abdominal cramping or pain.  You have rapid weight loss or gain.  You have shortness of breath with chest pain.  You notice sudden or extreme swelling   of your face, hands, ankles, feet, or legs.  You have not felt your baby move in over an hour.  You have severe headaches that do not go away with medicine.  You have vision changes. Document Released: 05/04/2001 Document Revised: 05/15/2013 Document Reviewed: 07/11/2012 [employer] Patient Information 2015 [employer]. This information is not intended to replace advice given to you by your health care provider. Make sure you discuss any questions you have with your health care provider.  

## 2014-04-01 NOTE — Progress Notes (Signed)
US 11/5 33 cm AFI, 79%ile. BP nl today, may continue NST here and MFM as scheduled.

## 2014-04-04 ENCOUNTER — Ambulatory Visit (INDEPENDENT_AMBULATORY_CARE_PROVIDER_SITE_OTHER): Payer: BC Managed Care – PPO | Admitting: *Deleted

## 2014-04-04 DIAGNOSIS — O133 Gestational [pregnancy-induced] hypertension without significant proteinuria, third trimester: Secondary | ICD-10-CM

## 2014-04-04 DIAGNOSIS — O10019 Pre-existing essential hypertension complicating pregnancy, unspecified trimester: Secondary | ICD-10-CM | POA: Diagnosis not present

## 2014-04-04 DIAGNOSIS — Z3483 Encounter for supervision of other normal pregnancy, third trimester: Secondary | ICD-10-CM

## 2014-04-04 DIAGNOSIS — O403XX1 Polyhydramnios, third trimester, fetus 1: Secondary | ICD-10-CM

## 2014-04-04 DIAGNOSIS — Z3A34 34 weeks gestation of pregnancy: Secondary | ICD-10-CM

## 2014-04-08 ENCOUNTER — Ambulatory Visit (INDEPENDENT_AMBULATORY_CARE_PROVIDER_SITE_OTHER): Payer: BC Managed Care – PPO | Admitting: Obstetrics & Gynecology

## 2014-04-08 ENCOUNTER — Encounter: Payer: Self-pay | Admitting: Obstetrics & Gynecology

## 2014-04-08 VITALS — BP 119/84 | HR 100 | Wt 281.0 lb

## 2014-04-08 DIAGNOSIS — O0993 Supervision of high risk pregnancy, unspecified, third trimester: Secondary | ICD-10-CM

## 2014-04-08 DIAGNOSIS — O403XX0 Polyhydramnios, third trimester, not applicable or unspecified: Secondary | ICD-10-CM | POA: Diagnosis not present

## 2014-04-08 NOTE — Progress Notes (Signed)
NST is reactive.

## 2014-04-08 NOTE — Patient Instructions (Signed)
Return to clinic for any obstetric concerns or go to [facility] for evaluation  

## 2014-04-08 NOTE — Progress Notes (Signed)
NST performed today was reviewed and was found to be reactive.  Continue recommended antenatal testing and prenatal care. Will continue to follow AFI.  Normal BP. No other complaints or concerns. Preeclampsia, labor and fetal movement precautions reviewed. Pelvic cultures next visit.

## 2014-04-11 ENCOUNTER — Other Ambulatory Visit: Payer: Self-pay | Admitting: Family Medicine

## 2014-04-11 ENCOUNTER — Ambulatory Visit (INDEPENDENT_AMBULATORY_CARE_PROVIDER_SITE_OTHER): Payer: BC Managed Care – PPO | Admitting: *Deleted

## 2014-04-11 DIAGNOSIS — O403XX0 Polyhydramnios, third trimester, not applicable or unspecified: Secondary | ICD-10-CM | POA: Diagnosis not present

## 2014-04-11 DIAGNOSIS — O133 Gestational [pregnancy-induced] hypertension without significant proteinuria, third trimester: Secondary | ICD-10-CM

## 2014-04-11 DIAGNOSIS — O0993 Supervision of high risk pregnancy, unspecified, third trimester: Secondary | ICD-10-CM

## 2014-04-11 DIAGNOSIS — Z3A35 35 weeks gestation of pregnancy: Secondary | ICD-10-CM

## 2014-04-11 DIAGNOSIS — O09293 Supervision of pregnancy with other poor reproductive or obstetric history, third trimester: Secondary | ICD-10-CM

## 2014-04-11 DIAGNOSIS — O403XX1 Polyhydramnios, third trimester, fetus 1: Secondary | ICD-10-CM

## 2014-04-15 ENCOUNTER — Ambulatory Visit (INDEPENDENT_AMBULATORY_CARE_PROVIDER_SITE_OTHER): Payer: BC Managed Care – PPO | Admitting: Obstetrics & Gynecology

## 2014-04-15 VITALS — BP 141/101 | HR 97 | Wt 283.0 lb

## 2014-04-15 DIAGNOSIS — Z36 Encounter for antenatal screening of mother: Secondary | ICD-10-CM | POA: Diagnosis not present

## 2014-04-15 DIAGNOSIS — O403XX1 Polyhydramnios, third trimester, fetus 1: Secondary | ICD-10-CM

## 2014-04-15 DIAGNOSIS — O10019 Pre-existing essential hypertension complicating pregnancy, unspecified trimester: Secondary | ICD-10-CM

## 2014-04-15 DIAGNOSIS — O09293 Supervision of pregnancy with other poor reproductive or obstetric history, third trimester: Secondary | ICD-10-CM

## 2014-04-15 LAB — OB RESULTS CONSOLE GC/CHLAMYDIA
CHLAMYDIA, DNA PROBE: NEGATIVE
GC PROBE AMP, GENITAL: NEGATIVE

## 2014-04-15 LAB — OB RESULTS CONSOLE GBS: GBS: NEGATIVE

## 2014-04-15 NOTE — Patient Instructions (Signed)
Return to clinic for any obstetric concerns or go to [facility] for evaluation  

## 2014-04-15 NOTE — Progress Notes (Signed)
Recheck 130/97.  BP elevated today, no symptoms. No severe features, will continue to monitor closely. Preeclampsia precautions reviewed. NST performed today was reviewed and was found to be reactive.  Continue recommended antenatal testing and prenatal care.  Scheduled for NST/AFI on 04/19/14.   Elevated fundal height, concerned about fetal size. Will follow up ultrasound.   Pelvic cultures today.  No other complaints or concerns.  Labor and fetal movement precautions reviewed.

## 2014-04-16 LAB — GC/CHLAMYDIA PROBE AMP
CT Probe RNA: NEGATIVE
GC PROBE AMP APTIMA: NEGATIVE

## 2014-04-17 LAB — CULTURE, BETA STREP (GROUP B ONLY)

## 2014-04-19 ENCOUNTER — Encounter (HOSPITAL_COMMUNITY): Payer: Self-pay

## 2014-04-19 ENCOUNTER — Ambulatory Visit (HOSPITAL_COMMUNITY)
Admission: RE | Admit: 2014-04-19 | Discharge: 2014-04-19 | Disposition: A | Discharge status: Home / Self Care | Payer: BC Managed Care – PPO | Source: Ambulatory Visit | Attending: Family Medicine | Admitting: Family Medicine

## 2014-04-19 DIAGNOSIS — O10019 Pre-existing essential hypertension complicating pregnancy, unspecified trimester: Secondary | ICD-10-CM | POA: Insufficient documentation

## 2014-04-19 DIAGNOSIS — O09293 Supervision of pregnancy with other poor reproductive or obstetric history, third trimester: Secondary | ICD-10-CM

## 2014-04-19 DIAGNOSIS — O09299 Supervision of pregnancy with other poor reproductive or obstetric history, unspecified trimester: Secondary | ICD-10-CM | POA: Diagnosis present

## 2014-04-19 DIAGNOSIS — O403XX1 Polyhydramnios, third trimester, fetus 1: Secondary | ICD-10-CM

## 2014-04-19 DIAGNOSIS — Z3A36 36 weeks gestation of pregnancy: Secondary | ICD-10-CM | POA: Diagnosis not present

## 2014-04-19 DIAGNOSIS — O403XX0 Polyhydramnios, third trimester, not applicable or unspecified: Secondary | ICD-10-CM | POA: Diagnosis present

## 2014-04-19 DIAGNOSIS — O99213 Obesity complicating pregnancy, third trimester: Secondary | ICD-10-CM | POA: Insufficient documentation

## 2014-04-20 ENCOUNTER — Encounter (HOSPITAL_COMMUNITY): Payer: Self-pay | Admitting: *Deleted

## 2014-04-20 ENCOUNTER — Inpatient Hospital Stay (HOSPITAL_COMMUNITY)
Admission: AD | Admit: 2014-04-20 | Discharge: 2014-04-20 | Disposition: A | Discharge status: Home / Self Care | Payer: BC Managed Care – PPO | Source: Ambulatory Visit | Attending: Obstetrics & Gynecology | Admitting: Obstetrics & Gynecology

## 2014-04-20 DIAGNOSIS — O10019 Pre-existing essential hypertension complicating pregnancy, unspecified trimester: Secondary | ICD-10-CM | POA: Diagnosis not present

## 2014-04-20 DIAGNOSIS — O9989 Other specified diseases and conditions complicating pregnancy, childbirth and the puerperium: Secondary | ICD-10-CM

## 2014-04-20 DIAGNOSIS — Z2839 Other underimmunization status: Secondary | ICD-10-CM

## 2014-04-20 DIAGNOSIS — O403XX1 Polyhydramnios, third trimester, fetus 1: Secondary | ICD-10-CM | POA: Diagnosis not present

## 2014-04-20 DIAGNOSIS — R51 Headache: Secondary | ICD-10-CM | POA: Insufficient documentation

## 2014-04-20 DIAGNOSIS — Z87891 Personal history of nicotine dependence: Secondary | ICD-10-CM | POA: Insufficient documentation

## 2014-04-20 DIAGNOSIS — O4703 False labor before 37 completed weeks of gestation, third trimester: Secondary | ICD-10-CM | POA: Insufficient documentation

## 2014-04-20 DIAGNOSIS — O26893 Other specified pregnancy related conditions, third trimester: Secondary | ICD-10-CM | POA: Insufficient documentation

## 2014-04-20 DIAGNOSIS — Z3A36 36 weeks gestation of pregnancy: Secondary | ICD-10-CM | POA: Insufficient documentation

## 2014-04-20 DIAGNOSIS — O09293 Supervision of pregnancy with other poor reproductive or obstetric history, third trimester: Secondary | ICD-10-CM | POA: Diagnosis not present

## 2014-04-20 DIAGNOSIS — O99212 Obesity complicating pregnancy, second trimester: Secondary | ICD-10-CM

## 2014-04-20 DIAGNOSIS — Z283 Underimmunization status: Secondary | ICD-10-CM

## 2014-04-20 DIAGNOSIS — R030 Elevated blood-pressure reading, without diagnosis of hypertension: Secondary | ICD-10-CM | POA: Diagnosis not present

## 2014-04-20 DIAGNOSIS — O0993 Supervision of high risk pregnancy, unspecified, third trimester: Secondary | ICD-10-CM

## 2014-04-20 LAB — CBC
HEMATOCRIT: 35.4 % — AB (ref 36.0–46.0)
Hemoglobin: 11.8 g/dL — ABNORMAL LOW (ref 12.0–15.0)
MCH: 26.9 pg (ref 26.0–34.0)
MCHC: 33.3 g/dL (ref 30.0–36.0)
MCV: 80.8 fL (ref 78.0–100.0)
Platelets: 273 10*3/uL (ref 150–400)
RBC: 4.38 MIL/uL (ref 3.87–5.11)
RDW: 13.2 % (ref 11.5–15.5)
WBC: 14.4 10*3/uL — ABNORMAL HIGH (ref 4.0–10.5)

## 2014-04-20 LAB — URINALYSIS, ROUTINE W REFLEX MICROSCOPIC
Bilirubin Urine: NEGATIVE
GLUCOSE, UA: NEGATIVE mg/dL
Hgb urine dipstick: NEGATIVE
Ketones, ur: NEGATIVE mg/dL
LEUKOCYTES UA: NEGATIVE
NITRITE: NEGATIVE
Protein, ur: NEGATIVE mg/dL
SPECIFIC GRAVITY, URINE: 1.01 (ref 1.005–1.030)
Urobilinogen, UA: 0.2 mg/dL (ref 0.0–1.0)
pH: 7.5 (ref 5.0–8.0)

## 2014-04-20 LAB — PROTEIN / CREATININE RATIO, URINE
Creatinine, Urine: 79.89 mg/dL
Protein Creatinine Ratio: 0.15 (ref 0.00–0.15)
Total Protein, Urine: 12 mg/dL

## 2014-04-20 LAB — COMPREHENSIVE METABOLIC PANEL
ALT: 14 U/L (ref 0–35)
AST: 12 U/L (ref 0–37)
Albumin: 2.6 g/dL — ABNORMAL LOW (ref 3.5–5.2)
Alkaline Phosphatase: 93 U/L (ref 39–117)
Anion gap: 12 (ref 5–15)
BUN: 6 mg/dL (ref 6–23)
CHLORIDE: 104 meq/L (ref 96–112)
CO2: 22 mEq/L (ref 19–32)
Calcium: 8.7 mg/dL (ref 8.4–10.5)
Creatinine, Ser: 0.72 mg/dL (ref 0.50–1.10)
GFR calc Af Amer: >90 mL/min (ref >90)
GFR calc non Af Amer: >90 mL/min (ref >90)
Glucose, Bld: 90 mg/dL (ref 70–99)
Potassium: 4.3 mEq/L (ref 3.7–5.3)
SODIUM: 138 meq/L (ref 137–147)
Total Protein: 5.9 g/dL — ABNORMAL LOW (ref 6.0–8.3)

## 2014-04-20 MED ORDER — ACETAMINOPHEN 325 MG PO TABS
650.0000 mg | ORAL_TABLET | Freq: Four times a day (QID) | ORAL | Status: DC | PRN
  Administered 2014-04-20: 650 mg via ORAL
  Filled 2014-04-20 (×2): qty 2

## 2014-04-20 NOTE — [facility] Note (Signed)
Pt states was seen in office and bp was 140/101. Given HTN precautions then. Beginning last pm, began having headache, and today has seen spots before eyes, and had ripples in her vision. B/P at home was 150/107 today.

## 2014-04-20 NOTE — MAU Note (Signed)
Took blood pressure earlier today at home, was 150/107.  Started having ripples and spots in her vision.  Denies VB/LOF.

## 2014-04-20 NOTE — MAU Provider Note (Signed)
  History     CSN: [id number]  Arrival date and time: 04/20/14 1644   First Provider Initiated Contact with Patient 04/20/14 1722      Chief Complaint  Patient presents with  . Hypertension   HPI  Patient is 28 y.o. [id number] here with complaints of elevated BP.  Patient was recently seen [place] (11/23) - BP elevated and given precautions to seek care if became symptomatic.  Patient had floaters and rippling in vision starting around 1200 today and mild HA starting today around 1515.  She had a CNA check BP (150/107).    +FM, denies LOF, VB, contractions (+Braxton Hicks), vaginal discharge.    Past Medical History  Diagnosis Date  . Hypertension   . Gestational diabetes mellitus, antepartum     Past Surgical History  Procedure Laterality Date  . Wisdom tooth extraction      Family History  Problem Relation Age of Onset  . Diabetes Father   . Hypertension Maternal Grandmother   . Hypertension Paternal Grandmother     History  Substance Use Topics  . Smoking status: Former Smoker    Quit date: 10/16/2005  . Smokeless tobacco: Never Used  . Alcohol Use: No    Allergies: No Known Allergies  Prescriptions prior to admission  Medication Sig Dispense Refill Last Dose  . aspirin (BABY ASPIRIN) 81 MG chewable tablet Chew 1 tablet (81 mg total) by mouth daily. 90 tablet 3 04/19/2014 at Unknown time  . Prenatal Multivit-Min-Fe-FA (PRENATAL VITAMINS) 0.8 MG tablet Take 1 tablet by mouth daily. 90 tablet 3 04/19/2014 at Unknown time    Review of Systems  Constitutional: Negative for fever and chills.  Respiratory: Negative for cough and shortness of breath.   Cardiovascular: Negative for chest pain and leg swelling.  Gastrointestinal: Negative for nausea and vomiting.  Genitourinary: Negative for dysuria and urgency.  Neurological: Positive for headaches.   Physical Exam   Blood pressure 148/82, pulse 123, temperature 97.9 F (36.6 C), temperature source Oral,  resp. rate 18, height 5\' 6"  (1.676 m), weight 127.914 kg (282 lb), last menstrual period 09/13/2013.  Physical Exam  Constitutional: She is oriented to person, place, and time. She appears well-developed and well-nourished. No distress.  HENT:  Head: Normocephalic and atraumatic.  Cardiovascular: Normal rate and intact distal pulses.   Respiratory: Effort normal. No respiratory distress.  GI: There is no tenderness.  Genitourinary: Vagina normal.  Cervix: 1/50/high  Musculoskeletal: She exhibits no edema or tenderness.  Neurological: She is alert and oriented to person, place, and time.  Skin: Skin is warm and dry. No rash noted.    MAU Course  Procedures  MDM FHM: baseline 145, reactive, accels present, reassuring HELLP labs: CBC stable, LFTs wnl, UP:C 0.15 UA wnl Irregular contractions q3-8 minutes noted on toco, mild, cervix 1/50, high  Assessment and Plan  Patient is 28 y.o. [id number] reporting elevated BP, [facility]. HELLP labs wnl. - fetal kick counts reinforced - preterm labor precautions [facility] ([facility]   Shirlee LatchBacigalupo, Angela 04/20/2014, 5:22 PM   I have participated in the care of this patient and I agree with the above. Cam HaiSHAW, Etheleen Valtierra CNM 10:59 PM 04/20/2014

## 2014-04-20 NOTE — Discharge Instructions (Signed)

## 2014-04-22 ENCOUNTER — Ambulatory Visit (INDEPENDENT_AMBULATORY_CARE_PROVIDER_SITE_OTHER): Payer: BC Managed Care – PPO | Admitting: Family Medicine

## 2014-04-22 VITALS — BP 143/106 | HR 124 | Wt 282.0 lb

## 2014-04-22 DIAGNOSIS — O10019 Pre-existing essential hypertension complicating pregnancy, unspecified trimester: Secondary | ICD-10-CM

## 2014-04-22 DIAGNOSIS — O0993 Supervision of high risk pregnancy, unspecified, third trimester: Secondary | ICD-10-CM

## 2014-04-22 DIAGNOSIS — O403XX1 Polyhydramnios, third trimester, fetus 1: Secondary | ICD-10-CM

## 2014-04-22 LAB — CBC
HCT: 33 % — ABNORMAL LOW (ref 36.0–46.0)
HEMOGLOBIN: 11.3 g/dL — AB (ref 12.0–15.0)
MCH: 26.3 pg (ref 26.0–34.0)
MCHC: 34.2 g/dL (ref 30.0–36.0)
MCV: 76.9 fL — AB (ref 78.0–100.0)
MPV: 9.7 fL (ref 9.4–12.4)
Platelets: 288 10*3/uL (ref 150–400)
RBC: 4.29 MIL/uL (ref 3.87–5.11)
RDW: 14 % (ref 11.5–15.5)
WBC: 12.3 10*3/uL — ABNORMAL HIGH (ref 4.0–10.5)

## 2014-04-22 NOTE — Patient Instructions (Signed)

## 2014-04-22 NOTE — Progress Notes (Signed)
NST reviewed and reactive. BP up again.  No headache. Few vision changes.--baseline BP 152/92 on no meds.  1+ protein today--will check labs and have low threshold for delivery.

## 2014-04-23 LAB — COMPREHENSIVE METABOLIC PANEL
ALT: 13 U/L (ref 0–35)
AST: 13 U/L (ref 0–37)
Albumin: 3.1 g/dL — ABNORMAL LOW (ref 3.5–5.2)
Alkaline Phosphatase: 90 U/L (ref 39–117)
BILIRUBIN TOTAL: 0.2 mg/dL (ref 0.2–1.2)
BUN: 9 mg/dL (ref 6–23)
CALCIUM: 8.7 mg/dL (ref 8.4–10.5)
CHLORIDE: 104 meq/L (ref 96–112)
CO2: 22 mEq/L (ref 19–32)
CREATININE: 0.62 mg/dL (ref 0.50–1.10)
GLUCOSE: 79 mg/dL (ref 70–99)
Potassium: 4.1 mEq/L (ref 3.5–5.3)
Sodium: 135 mEq/L (ref 135–145)
Total Protein: 5.6 g/dL — ABNORMAL LOW (ref 6.0–8.3)

## 2014-04-23 LAB — PROTEIN / CREATININE RATIO, URINE
CREATININE, URINE: 270.5 mg/dL
Protein Creatinine Ratio: 0.16 — ABNORMAL HIGH (ref <0.15)
TOTAL PROTEIN, URINE: 42 mg/dL — AB (ref 5–24)

## 2014-04-25 ENCOUNTER — Inpatient Hospital Stay (HOSPITAL_COMMUNITY)
Admission: AD | Admit: 2014-04-25 | Discharge: 2014-04-25 | Disposition: A | Discharge status: Home / Self Care | Payer: BC Managed Care – PPO | Source: Ambulatory Visit | Attending: Obstetrics & Gynecology | Admitting: Obstetrics & Gynecology

## 2014-04-25 ENCOUNTER — Ambulatory Visit (INDEPENDENT_AMBULATORY_CARE_PROVIDER_SITE_OTHER): Payer: BC Managed Care – PPO | Admitting: *Deleted

## 2014-04-25 ENCOUNTER — Inpatient Hospital Stay (HOSPITAL_COMMUNITY): Payer: BC Managed Care – PPO

## 2014-04-25 VITALS — BP 125/89 | HR 99 | Wt 283.8 lb

## 2014-04-25 DIAGNOSIS — O289 Unspecified abnormal findings on antenatal screening of mother: Secondary | ICD-10-CM | POA: Diagnosis not present

## 2014-04-25 DIAGNOSIS — O10013 Pre-existing essential hypertension complicating pregnancy, third trimester: Secondary | ICD-10-CM | POA: Diagnosis not present

## 2014-04-25 DIAGNOSIS — O10019 Pre-existing essential hypertension complicating pregnancy, unspecified trimester: Secondary | ICD-10-CM

## 2014-04-25 DIAGNOSIS — Z87891 Personal history of nicotine dependence: Secondary | ICD-10-CM | POA: Insufficient documentation

## 2014-04-25 DIAGNOSIS — O0993 Supervision of high risk pregnancy, unspecified, third trimester: Secondary | ICD-10-CM

## 2014-04-25 DIAGNOSIS — O403XX1 Polyhydramnios, third trimester, fetus 1: Secondary | ICD-10-CM

## 2014-04-25 DIAGNOSIS — O403XX0 Polyhydramnios, third trimester, not applicable or unspecified: Secondary | ICD-10-CM | POA: Diagnosis not present

## 2014-04-25 DIAGNOSIS — Z3A37 37 weeks gestation of pregnancy: Secondary | ICD-10-CM | POA: Insufficient documentation

## 2014-04-25 DIAGNOSIS — O288 Other abnormal findings on antenatal screening of mother: Secondary | ICD-10-CM | POA: Insufficient documentation

## 2014-04-25 LAB — COMPREHENSIVE METABOLIC PANEL
ALT: 12 U/L (ref 0–35)
AST: 12 U/L (ref 0–37)
Albumin: 2.4 g/dL — ABNORMAL LOW (ref 3.5–5.2)
Alkaline Phosphatase: 94 U/L (ref 39–117)
Anion gap: 16 — ABNORMAL HIGH (ref 5–15)
BUN: 7 mg/dL (ref 6–23)
CALCIUM: 8.5 mg/dL (ref 8.4–10.5)
CO2: 20 mEq/L (ref 19–32)
CREATININE: 0.66 mg/dL (ref 0.50–1.10)
Chloride: 101 mEq/L (ref 96–112)
GFR calc Af Amer: >90 mL/min (ref >90)
GFR calc non Af Amer: >90 mL/min (ref >90)
Glucose, Bld: 91 mg/dL (ref 70–99)
Potassium: 4.2 mEq/L (ref 3.7–5.3)
SODIUM: 137 meq/L (ref 137–147)
Total Bilirubin: <0.2 mg/dL — ABNORMAL LOW (ref 0.3–1.2)
Total Protein: 5.7 g/dL — ABNORMAL LOW (ref 6.0–8.3)

## 2014-04-25 LAB — CBC
HCT: 33.6 % — ABNORMAL LOW (ref 36.0–46.0)
Hemoglobin: 11.1 g/dL — ABNORMAL LOW (ref 12.0–15.0)
MCH: 26.7 pg (ref 26.0–34.0)
MCHC: 33 g/dL (ref 30.0–36.0)
MCV: 81 fL (ref 78.0–100.0)
Platelets: 278 10*3/uL (ref 150–400)
RBC: 4.15 MIL/uL (ref 3.87–5.11)
RDW: 13.2 % (ref 11.5–15.5)
WBC: 14 10*3/uL — AB (ref 4.0–10.5)

## 2014-04-25 NOTE — Discharge Instructions (Signed)
Hypertension During Pregnancy Hypertension, or high blood pressure, is when there is extra pressure inside your blood vessels that carry blood from the heart to the rest of your body (arteries). It can happen at any time in life, including pregnancy. Hypertension during pregnancy can cause problems for you and your baby. Your baby might not weigh as much as he or she should at birth or might be born early (premature). Very bad cases of hypertension during pregnancy can be life-threatening.  Different types of hypertension can occur during pregnancy. These include:  Chronic hypertension. This happens when a woman has hypertension before pregnancy and it continues during pregnancy.  Gestational hypertension. This is when hypertension develops during pregnancy.  Preeclampsia or toxemia of pregnancy. This is a very serious type of hypertension that develops only during pregnancy. It affects the whole body and can be very dangerous for both mother and baby.  Gestational hypertension and preeclampsia usually go away after your baby is born. Your blood pressure will likely stabilize within 6 weeks. Women who have hypertension during pregnancy have a greater chance of developing hypertension later in life or with future pregnancies. RISK FACTORS There are certain factors that make it more likely for you to develop hypertension during pregnancy. These include:  Having hypertension before pregnancy.  Having hypertension during a previous pregnancy.  Being overweight.  Being older than 40 years.  Being pregnant with more than one baby.  Having diabetes or kidney problems. SIGNS AND SYMPTOMS Chronic and gestational hypertension rarely cause symptoms. Preeclampsia has symptoms, which may include:  Increased protein in your urine. Your health care provider will check for this at every prenatal visit.  Swelling of your hands and face.  Rapid weight gain.  Headaches.  Visual changes.  Being  bothered by light.  Abdominal pain, especially in the upper right area.  Chest pain.  Shortness of breath.  Increased reflexes.  Seizures. These occur with a more severe form of preeclampsia, called eclampsia. DIAGNOSIS  You may be diagnosed with hypertension during a regular prenatal exam. At each prenatal visit, you may have:  Your blood pressure checked.  A urine test to check for protein in your urine. The type of hypertension you are diagnosed with depends on when you developed it. It also depends on your specific blood pressure reading.  Developing hypertension before 20 weeks of pregnancy is consistent with chronic hypertension.  Developing hypertension after 20 weeks of pregnancy is consistent with gestational hypertension.  Hypertension with increased urinary protein is diagnosed as preeclampsia.  Blood pressure measurements that stay above 160 systolic or 110 diastolic are a sign of severe preeclampsia. TREATMENT Treatment for hypertension during pregnancy varies. Treatment depends on the type of hypertension and how serious it is.  If you take medicine for chronic hypertension, you may need to switch medicines.  Medicines called ACE inhibitors should not be taken during pregnancy.  Low-dose aspirin may be suggested for women who have risk factors for preeclampsia.  If you have gestational hypertension, you may need to take a blood pressure medicine that is safe during pregnancy. Your health care provider will recommend the correct medicine.  If you have severe preeclampsia, you may need to be in the hospital. Health care providers will watch you and your baby very closely. You also may need to take medicine called magnesium sulfate to prevent seizures and lower blood pressure.  Sometimes, an early delivery is needed. This may be the case if the condition worsens. It would be   done to protect you and your baby. The only cure for preeclampsia is delivery.  Your health  care provider may recommend that you take one low-dose aspirin (81 mg) each day to help prevent high blood pressure during your pregnancy if you are at risk for preeclampsia. You may be at risk for preeclampsia if:  You had preeclampsia or eclampsia during a previous pregnancy.  Your baby did not grow as expected during a previous pregnancy.  You experienced preterm birth with a previous pregnancy.  You experienced a separation of the placenta from the uterus (placental abruption) during a previous pregnancy.  You experienced the loss of your baby during a previous pregnancy.  You are pregnant with more than one baby.  You have other medical conditions, such as diabetes or an autoimmune disease. HOME CARE INSTRUCTIONS  Schedule and keep all of your regular prenatal care appointments. This is important.  Take medicines only as directed by your health care provider. Tell your health care provider about all medicines you take.  Eat as little salt as possible.  Get regular exercise.  Do not drink alcohol.  Do not use tobacco products.  Do not drink products with caffeine.  Lie on your left side when resting. SEEK IMMEDIATE MEDICAL CARE IF:  You have severe abdominal pain.  You have sudden swelling in your hands, ankles, or face.  You gain 4 pounds (1.8 kg) or more in 1 week.  You vomit repeatedly.  You have vaginal bleeding.  You do not feel your baby moving as much.  You have a headache.  You have blurred or double vision.  You have muscle twitching or spasms.  You have shortness of breath.  You have blue fingernails or lips.  You have blood in your urine. MAKE SURE YOU:  Understand these instructions.  Will watch your condition.  Will get help right away if you are not doing well or get worse. Document Released: 01/26/2011 Document Revised: 09/24/2013 Document Reviewed: 12/07/2012 [employer] Patient Information 2015 [employer]. This information is not  intended to replace advice given to you by your health care provider. Make sure you discuss any questions you have with your health care provider.   Fetal Movement Counts Patient Name: __________________________________________________ Patient Due Date: ____________________ Performing a fetal movement count is highly recommended in high-risk pregnancies, but it is good for every pregnant woman to do. Your health care provider may ask you to start counting fetal movements at 28 weeks of the pregnancy. Fetal movements often increase:  After eating a full meal.  After physical activity.  After eating or drinking something sweet or cold.  At rest. Pay attention to when you feel the baby is most active. This will help you notice a pattern of your baby's sleep and wake cycles and what factors contribute to an increase in fetal movement. It is important to perform a fetal movement count at the same time each day when your baby is normally most active.  HOW TO COUNT FETAL MOVEMENTS  Find a quiet and comfortable area to sit or lie down on your left side. Lying on your left side provides the best blood and oxygen circulation to your baby.  Write down the day and time on a sheet of paper or in a journal.  Start counting kicks, flutters, swishes, rolls, or jabs in a 2-hour period. You should feel at least 10 movements within 2 hours.  If you do not feel 10 movements in 2 hours, wait 2-3   hours and count again. Look for a change in the pattern or not enough counts in 2 hours. SEEK MEDICAL CARE IF:  You feel less than 10 counts in 2 hours, tried twice.  There is no movement in over an hour.  The pattern is changing or taking longer each day to reach 10 counts in 2 hours.  You feel the baby is not moving as he or she usually does. Date: ____________ Movements: ____________ Start time: ____________ Finish time: ____________  Date: ____________ Movements: ____________ Start time: ____________ Finish  time: ____________ Date: ____________ Movements: ____________ Start time: ____________ Finish time: ____________ Date: ____________ Movements: ____________ Start time: ____________ Finish time: ____________ Date: ____________ Movements: ____________ Start time: ____________ Finish time: ____________ Date: ____________ Movements: ____________ Start time: ____________ Finish time: ____________ Date: ____________ Movements: ____________ Start time: ____________ Finish time: ____________ Date: ____________ Movements: ____________ Start time: ____________ Finish time: ____________  Date: ____________ Movements: ____________ Start time: ____________ Finish time: ____________ Date: ____________ Movements: ____________ Start time: ____________ Finish time: ____________ Date: ____________ Movements: ____________ Start time: ____________ Finish time: ____________ Date: ____________ Movements: ____________ Start time: ____________ Finish time: ____________ Date: ____________ Movements: ____________ Start time: ____________ Finish time: ____________ Date: ____________ Movements: ____________ Start time: ____________ Finish time: ____________ Date: ____________ Movements: ____________ Start time: ____________ Finish time: ____________  Date: ____________ Movements: ____________ Start time: ____________ Finish time: ____________ Date: ____________ Movements: ____________ Start time: ____________ Finish time: ____________ Date: ____________ Movements: ____________ Start time: ____________ Finish time: ____________ Date: ____________ Movements: ____________ Start time: ____________ Finish time: ____________ Date: ____________ Movements: ____________ Start time: ____________ Finish time: ____________ Date: ____________ Movements: ____________ Start time: ____________ Finish time: ____________ Date: ____________ Movements: ____________ Start time: ____________ Finish time: ____________  Date: ____________  Movements: ____________ Start time: ____________ Finish time: ____________ Date: ____________ Movements: ____________ Start time: ____________ Finish time: ____________ Date: ____________ Movements: ____________ Start time: ____________ Finish time: ____________ Date: ____________ Movements: ____________ Start time: ____________ Finish time: ____________ Date: ____________ Movements: ____________ Start time: ____________ Finish time: ____________ Date: ____________ Movements: ____________ Start time: ____________ Finish time: ____________ Date: ____________ Movements: ____________ Start time: ____________ Finish time: ____________  Date: ____________ Movements: ____________ Start time: ____________ Finish time: ____________ Date: ____________ Movements: ____________ Start time: ____________ Finish time: ____________ Date: ____________ Movements: ____________ Start time: ____________ Finish time: ____________ Date: ____________ Movements: ____________ Start time: ____________ Finish time: ____________ Date: ____________ Movements: ____________ Start time: ____________ Finish time: ____________ Date: ____________ Movements: ____________ Start time: ____________ Finish time: ____________ Date: ____________ Movements: ____________ Start time: ____________ Finish time: ____________  Date: ____________ Movements: ____________ Start time: ____________ Finish time: ____________ Date: ____________ Movements: ____________ Start time: ____________ Finish time: ____________ Date: ____________ Movements: ____________ Start time: ____________ Finish time: ____________ Date: ____________ Movements: ____________ Start time: ____________ Finish time: ____________ Date: ____________ Movements: ____________ Start time: ____________ Finish time: ____________ Date: ____________ Movements: ____________ Start time: ____________ Finish time: ____________ Date: ____________ Movements: ____________ Start time:  ____________ Finish time: ____________  Date: ____________ Movements: ____________ Start time: ____________ Finish time: ____________ Date: ____________ Movements: ____________ Start time: ____________ Finish time: ____________ Date: ____________ Movements: ____________ Start time: ____________ Finish time: ____________ Date: ____________ Movements: ____________ Start time: ____________ Finish time: ____________ Date: ____________ Movements: ____________ Start time: ____________ Finish time: ____________ Date: ____________ Movements: ____________ Start time: ____________ Finish time: ____________ Date: ____________ Movements: ____________ Start time: ____________ Finish time: ____________  Date: ____________ Movements: ____________ Start time: ____________ Finish time: ____________ Date: ____________ Movements: ____________ Start   time: ____________ Finish time: ____________ Date: ____________ Movements: ____________ Start time: ____________ Finish time: ____________ Date: ____________ Movements: ____________ Start time: ____________ Finish time: ____________ Date: ____________ Movements: ____________ Start time: ____________ Finish time: ____________ Date: ____________ Movements: ____________ Start time: ____________ Finish time: ____________ Document Released: 06/09/2006 Document Revised: 09/24/2013 Document Reviewed: 03/06/2012 [employer] Patient Information 2015 [employer]. This information is not intended to replace advice given to you by your health care provider. Make sure you discuss any questions you have with your health care provider.  

## 2014-04-25 NOTE — MAU Provider Note (Signed)
None     Chief Complaint:  non-reactive tracing    Garth SchlatterLeslie Pantoja is  28 y.o. G3P2002 at 845w3d presents complaining of non-reactive tracing   She is in twice weekly testing for polyhydramnios and chronic hypertension.    Obstetrical/Gynecological History: OB History    Gravida Para Term Preterm AB TAB SAB Ectopic Multiple Living   3 2 2       2      Past Medical History: Past Medical History  Diagnosis Date  . Hypertension   . Gestational diabetes mellitus, antepartum     Past Surgical History: Past Surgical History  Procedure Laterality Date  . Wisdom tooth extraction      Family History: Family History  Problem Relation Age of Onset  . Diabetes Father   . Hypertension Maternal Grandmother   . Hypertension Paternal Grandmother     Social History: History  Substance Use Topics  . Smoking status: Former Smoker    Quit date: 10/16/2005  . Smokeless tobacco: Never Used  . Alcohol Use: No    Allergies: No Known Allergies  Meds:  Prescriptions prior to admission  Medication Sig Dispense Refill Last Dose  . aspirin (BABY ASPIRIN) 81 MG chewable tablet Chew 1 tablet (81 mg total) by mouth daily. 90 tablet 3 04/19/2014 at Unknown time  . Prenatal Multivit-Min-Fe-FA (PRENATAL VITAMINS) 0.8 MG tablet Take 1 tablet by mouth daily. 90 tablet 3 04/19/2014 at Unknown time    Review of Systems   Constitutional: Negative for fever and chills Eyes: Negative for visual disturbances Respiratory: Negative for shortness of breath, dyspnea Cardiovascular: Negative for chest pain or palpitations  Gastrointestinal: Negative for vomiting, diarrhea and constipation Genitourinary: Negative for dysuria and urgency Musculoskeletal: Negative for back pain, joint pain, myalgias  Neurological: Negative for dizziness and headaches     Physical Exam  Blood pressure 121/72, pulse 113, temperature 97.7 F (36.5 C), resp. rate 18, last menstrual period 09/13/2013.   Labs: Results  for orders placed or performed during the hospital encounter of 04/25/14 (from the past 24 hour(s))  CBC   Collection Time: 04/25/14  6:46 PM  Result Value Ref Range   WBC 14.0 (H) 4.0 - 10.5 K/uL   RBC 4.15 3.87 - 5.11 MIL/uL   Hemoglobin 11.1 (L) 12.0 - 15.0 g/dL   HCT 65.733.6 (L) 84.636.0 - 96.246.0 %   MCV 81.0 78.0 - 100.0 fL   MCH 26.7 26.0 - 34.0 pg   MCHC 33.0 30.0 - 36.0 g/dL   RDW 95.213.2 84.111.5 - 32.415.5 %   Platelets 278 150 - 400 K/uL   Imaging Studies:   BPP now 10/10.    Assessment: Garth SchlatterLeslie Crihfield is  28 y.o. G3P2002 at 6645w3d presents with BPP.10/10 following a non reactive NST in clinic  Plan: F/U Monday as scheduled.  Kick counts discussed  CRESENZO-DISHMAN,Linn Clavin 12/3/20157:16 PM

## 2014-04-25 NOTE — MAU Note (Signed)
Bi-weekly NST's for hypertension and poly.  Sent here because of non-reactive tracing. No bleeding, leaking or pain.

## 2014-04-25 NOTE — Progress Notes (Signed)
NST reviewed and reassuring FHR of 150 but not reactive.  Advised patient to MAU for further evaluation.  She is continuing to have spots in her vision.

## 2014-04-29 ENCOUNTER — Ambulatory Visit (INDEPENDENT_AMBULATORY_CARE_PROVIDER_SITE_OTHER): Payer: BC Managed Care – PPO | Admitting: Obstetrics & Gynecology

## 2014-04-29 VITALS — BP 123/90 | HR 105 | Wt 286.2 lb

## 2014-04-29 DIAGNOSIS — O403XX1 Polyhydramnios, third trimester, fetus 1: Secondary | ICD-10-CM

## 2014-04-29 DIAGNOSIS — O10019 Pre-existing essential hypertension complicating pregnancy, unspecified trimester: Secondary | ICD-10-CM | POA: Diagnosis not present

## 2014-04-29 DIAGNOSIS — O09293 Supervision of pregnancy with other poor reproductive or obstetric history, third trimester: Secondary | ICD-10-CM

## 2014-04-29 NOTE — Progress Notes (Signed)
NST reviewed and reactive.  Patient will follow up Thursday for NST and AFI

## 2014-04-29 NOTE — Patient Instructions (Signed)
Return to clinic for any obstetric concerns or go to [facility] for evaluation  

## 2014-04-29 NOTE — Progress Notes (Signed)
NST performed today was reviewed and was found to be reactive.  Continue recommended antenatal testing and prenatal care. Normal BP.  Preeclampsia, labor and fetal movement precautions reviewed.  IOL scheduled for 05/06/14 at 0730.

## 2014-04-30 ENCOUNTER — Telehealth (HOSPITAL_COMMUNITY): Payer: Self-pay | Admitting: *Deleted

## 2014-04-30 DIAGNOSIS — O288 Other abnormal findings on antenatal screening of mother: Secondary | ICD-10-CM | POA: Insufficient documentation

## 2014-04-30 NOTE — Telephone Encounter (Signed)
Preadmission screen  

## 2014-05-02 ENCOUNTER — Ambulatory Visit (INDEPENDENT_AMBULATORY_CARE_PROVIDER_SITE_OTHER): Payer: BC Managed Care – PPO | Admitting: *Deleted

## 2014-05-02 VITALS — BP 143/90 | HR 83 | Wt 287.8 lb

## 2014-05-02 DIAGNOSIS — O09293 Supervision of pregnancy with other poor reproductive or obstetric history, third trimester: Secondary | ICD-10-CM

## 2014-05-02 DIAGNOSIS — O403XX1 Polyhydramnios, third trimester, fetus 1: Secondary | ICD-10-CM

## 2014-05-02 DIAGNOSIS — O403XX0 Polyhydramnios, third trimester, not applicable or unspecified: Secondary | ICD-10-CM

## 2014-05-02 DIAGNOSIS — O133 Gestational [pregnancy-induced] hypertension without significant proteinuria, third trimester: Secondary | ICD-10-CM

## 2014-05-02 NOTE — Progress Notes (Signed)
NST is reviewed and reactive AFI is 28.3cm    Patient is scheduled for IOL 05/06/14 at 0730.

## 2014-05-06 ENCOUNTER — Inpatient Hospital Stay (HOSPITAL_COMMUNITY): Payer: BC Managed Care – PPO | Admitting: Anesthesiology

## 2014-05-06 ENCOUNTER — Encounter (HOSPITAL_COMMUNITY): Payer: Self-pay

## 2014-05-06 ENCOUNTER — Encounter: Payer: Self-pay | Admitting: *Deleted

## 2014-05-06 ENCOUNTER — Inpatient Hospital Stay (HOSPITAL_COMMUNITY)
Admission: RE | Admit: 2014-05-06 | Discharge: 2014-05-07 | DRG: 775 | Disposition: A | Discharge status: Home / Self Care | Payer: BC Managed Care – PPO | Source: Ambulatory Visit | Attending: Family Medicine | Admitting: Family Medicine

## 2014-05-06 VITALS — BP 142/72 | HR 90 | Temp 98.1°F | Resp 18 | Ht 66.0 in | Wt 287.8 lb

## 2014-05-06 DIAGNOSIS — O133 Gestational [pregnancy-induced] hypertension without significant proteinuria, third trimester: Principal | ICD-10-CM | POA: Diagnosis present

## 2014-05-06 DIAGNOSIS — O09899 Supervision of other high risk pregnancies, unspecified trimester: Secondary | ICD-10-CM

## 2014-05-06 DIAGNOSIS — O99210 Obesity complicating pregnancy, unspecified trimester: Secondary | ICD-10-CM

## 2014-05-06 DIAGNOSIS — O403XX0 Polyhydramnios, third trimester, not applicable or unspecified: Secondary | ICD-10-CM | POA: Diagnosis not present

## 2014-05-06 DIAGNOSIS — O09291 Supervision of pregnancy with other poor reproductive or obstetric history, first trimester: Secondary | ICD-10-CM

## 2014-05-06 DIAGNOSIS — O10019 Pre-existing essential hypertension complicating pregnancy, unspecified trimester: Secondary | ICD-10-CM

## 2014-05-06 DIAGNOSIS — O409XX0 Polyhydramnios, unspecified trimester, not applicable or unspecified: Secondary | ICD-10-CM | POA: Diagnosis present

## 2014-05-06 DIAGNOSIS — Z3A39 39 weeks gestation of pregnancy: Secondary | ICD-10-CM | POA: Diagnosis present

## 2014-05-06 DIAGNOSIS — O9989 Other specified diseases and conditions complicating pregnancy, childbirth and the puerperium: Secondary | ICD-10-CM

## 2014-05-06 DIAGNOSIS — Z283 Underimmunization status: Secondary | ICD-10-CM

## 2014-05-06 DIAGNOSIS — O0993 Supervision of high risk pregnancy, unspecified, third trimester: Secondary | ICD-10-CM

## 2014-05-06 DIAGNOSIS — Z3483 Encounter for supervision of other normal pregnancy, third trimester: Secondary | ICD-10-CM | POA: Diagnosis present

## 2014-05-06 LAB — RPR

## 2014-05-06 LAB — CBC
HCT: 33.7 % — ABNORMAL LOW (ref 36.0–46.0)
HCT: 34.1 % — ABNORMAL LOW (ref 36.0–46.0)
HCT: 33.6 % — ABNORMAL LOW (ref 36.0–46.0)
HEMOGLOBIN: 11.3 g/dL — AB (ref 12.0–15.0)
Hemoglobin: 11.2 g/dL — ABNORMAL LOW (ref 12.0–15.0)
Hemoglobin: 11 g/dL — ABNORMAL LOW (ref 12.0–15.0)
MCH: 26.5 pg (ref 26.0–34.0)
MCH: 26.6 pg (ref 26.0–34.0)
MCH: 26.2 pg (ref 26.0–34.0)
MCHC: 33.2 g/dL (ref 30.0–36.0)
MCHC: 33.1 g/dL (ref 30.0–36.0)
MCHC: 32.7 g/dL (ref 30.0–36.0)
MCV: 79.7 fL (ref 78.0–100.0)
MCV: 80.2 fL (ref 78.0–100.0)
MCV: 80 fL (ref 78.0–100.0)
PLATELETS: 241 10*3/uL (ref 150–400)
Platelets: 247 10*3/uL (ref 150–400)
Platelets: 267 10*3/uL (ref 150–400)
RBC: 4.23 MIL/uL (ref 3.87–5.11)
RBC: 4.25 MIL/uL (ref 3.87–5.11)
RBC: 4.2 MIL/uL (ref 3.87–5.11)
RDW: 13.3 % (ref 11.5–15.5)
RDW: 13.3 % (ref 11.5–15.5)
RDW: 13.2 % (ref 11.5–15.5)
WBC: 11.4 10*3/uL — ABNORMAL HIGH (ref 4.0–10.5)
WBC: 16.3 10*3/uL — ABNORMAL HIGH (ref 4.0–10.5)
WBC: 20.2 10*3/uL — ABNORMAL HIGH (ref 4.0–10.5)

## 2014-05-06 LAB — ABO/RH: ABO/RH(D): B POS

## 2014-05-06 LAB — TYPE AND SCREEN
ABO/RH(D): B POS
ANTIBODY SCREEN: NEGATIVE

## 2014-05-06 MED ORDER — OXYTOCIN BOLUS FROM INFUSION: 500.0000 mL | INTRAVENOUS | Status: DC

## 2014-05-06 MED ORDER — OXYCODONE-ACETAMINOPHEN 5-325 MG PO TABS: 1.0000 | ORAL_TABLET | ORAL | Status: DC | PRN

## 2014-05-06 MED ORDER — FLEET ENEMA 7-19 GM/118ML RE ENEM: 1.0000 | ENEMA | RECTAL | Status: DC | PRN

## 2014-05-06 MED ORDER — PHENYLEPHRINE 40 MCG/ML (10ML) SYRINGE FOR IV PUSH (FOR BLOOD PRESSURE SUPPORT)
80.0000 ug | PREFILLED_SYRINGE | INTRAVENOUS | Status: DC | PRN
  Filled 2014-05-06: qty 2
  Filled 2014-05-06: qty 10

## 2014-05-06 MED ORDER — DIPHENHYDRAMINE HCL 25 MG PO CAPS: 25.0000 mg | ORAL_CAPSULE | Freq: Four times a day (QID) | ORAL | Status: DC | PRN

## 2014-05-06 MED ORDER — LIDOCAINE HCL (PF) 1 % IJ SOLN
INTRAMUSCULAR | Status: DC | PRN
  Administered 2014-05-06 (×4): 4 mL

## 2014-05-06 MED ORDER — OXYTOCIN 40 UNITS IN LACTATED RINGERS INFUSION - SIMPLE MED
1.0000 m[IU]/min | INTRAVENOUS | Status: DC
  Administered 2014-05-06: 2 m[IU]/min via INTRAVENOUS
  Filled 2014-05-06: qty 1000

## 2014-05-06 MED ORDER — IBUPROFEN 600 MG PO TABS
600.0000 mg | ORAL_TABLET | Freq: Four times a day (QID) | ORAL | Status: DC
  Administered 2014-05-06 – 2014-05-07 (×3): 600 mg via ORAL
  Filled 2014-05-06 (×3): qty 1

## 2014-05-06 MED ORDER — DIPHENHYDRAMINE HCL 50 MG/ML IJ SOLN: 12.5000 mg | INTRAMUSCULAR | Status: DC | PRN

## 2014-05-06 MED ORDER — ACETAMINOPHEN 325 MG PO TABS: 650.0000 mg | ORAL_TABLET | ORAL | Status: DC | PRN

## 2014-05-06 MED ORDER — LANOLIN HYDROUS EX OINT: TOPICAL_OINTMENT | CUTANEOUS | Status: DC | PRN

## 2014-05-06 MED ORDER — ONDANSETRON HCL 4 MG/2ML IJ SOLN: 4.0000 mg | INTRAMUSCULAR | Status: DC | PRN

## 2014-05-06 MED ORDER — SENNOSIDES-DOCUSATE SODIUM 8.6-50 MG PO TABS
2.0000 | ORAL_TABLET | ORAL | Status: DC
  Administered 2014-05-06: 2 via ORAL
  Filled 2014-05-06: qty 2

## 2014-05-06 MED ORDER — OXYCODONE-ACETAMINOPHEN 5-325 MG PO TABS: 2.0000 | ORAL_TABLET | ORAL | Status: DC | PRN

## 2014-05-06 MED ORDER — LACTATED RINGERS IV SOLN: 500.0000 mL | INTRAVENOUS | Status: DC | PRN

## 2014-05-06 MED ORDER — TERBUTALINE SULFATE 1 MG/ML IJ SOLN: 0.2500 mg | Freq: Once | INTRAMUSCULAR | Status: DC | PRN

## 2014-05-06 MED ORDER — CITRIC ACID-SODIUM CITRATE 334-500 MG/5ML PO SOLN: 30.0000 mL | ORAL | Status: DC | PRN

## 2014-05-06 MED ORDER — ONDANSETRON HCL 4 MG/2ML IJ SOLN: 4.0000 mg | Freq: Four times a day (QID) | INTRAMUSCULAR | Status: DC | PRN

## 2014-05-06 MED ORDER — EPHEDRINE 5 MG/ML INJ
10.0000 mg | INTRAVENOUS | Status: DC | PRN
  Filled 2014-05-06: qty 2

## 2014-05-06 MED ORDER — WITCH HAZEL-GLYCERIN EX PADS: 1.0000 "application " | MEDICATED_PAD | CUTANEOUS | Status: DC | PRN

## 2014-05-06 MED ORDER — OXYTOCIN 40 UNITS IN LACTATED RINGERS INFUSION - SIMPLE MED: 62.5000 mL/h | INTRAVENOUS | Status: DC

## 2014-05-06 MED ORDER — PHENYLEPHRINE 40 MCG/ML (10ML) SYRINGE FOR IV PUSH (FOR BLOOD PRESSURE SUPPORT)
80.0000 ug | PREFILLED_SYRINGE | INTRAVENOUS | Status: DC | PRN
  Filled 2014-05-06: qty 2

## 2014-05-06 MED ORDER — LACTATED RINGERS IV SOLN
INTRAVENOUS | Status: DC
  Administered 2014-05-06: 15:00:00 via INTRAVENOUS

## 2014-05-06 MED ORDER — FENTANYL 2.5 MCG/ML BUPIVACAINE 1/10 % EPIDURAL INFUSION (WH - ANES)
14.0000 mL/h | INTRAMUSCULAR | Status: DC | PRN
  Administered 2014-05-06: 14 mL/h via EPIDURAL
  Filled 2014-05-06: qty 125

## 2014-05-06 MED ORDER — SIMETHICONE 80 MG PO CHEW: 80.0000 mg | CHEWABLE_TABLET | ORAL | Status: DC | PRN

## 2014-05-06 MED ORDER — PRENATAL MULTIVITAMIN CH
1.0000 | ORAL_TABLET | Freq: Every day | ORAL | Status: DC
  Administered 2014-05-07: 1 via ORAL
  Filled 2014-05-06: qty 1

## 2014-05-06 MED ORDER — ZOLPIDEM TARTRATE 5 MG PO TABS: 5.0000 mg | ORAL_TABLET | Freq: Every evening | ORAL | Status: DC | PRN

## 2014-05-06 MED ORDER — TETANUS-DIPHTH-ACELL PERTUSSIS 5-2.5-18.5 LF-MCG/0.5 IM SUSP: 0.5000 mL | Freq: Once | INTRAMUSCULAR | Status: DC

## 2014-05-06 MED ORDER — BENZOCAINE-MENTHOL 20-0.5 % EX AERO: 1.0000 "application " | INHALATION_SPRAY | CUTANEOUS | Status: DC | PRN

## 2014-05-06 MED ORDER — DIBUCAINE 1 % RE OINT: 1.0000 "application " | TOPICAL_OINTMENT | RECTAL | Status: DC | PRN

## 2014-05-06 MED ORDER — ONDANSETRON HCL 4 MG PO TABS: 4.0000 mg | ORAL_TABLET | ORAL | Status: DC | PRN

## 2014-05-06 MED ORDER — LACTATED RINGERS IV SOLN
500.0000 mL | Freq: Once | INTRAVENOUS | Status: AC
  Administered 2014-05-06: 500 mL via INTRAVENOUS

## 2014-05-06 MED ORDER — LIDOCAINE HCL (PF) 1 % IJ SOLN
30.0000 mL | INTRAMUSCULAR | Status: DC | PRN
  Filled 2014-05-06: qty 30

## 2014-05-06 NOTE — Progress Notes (Signed)
FMLA forms filled out for patients disability as she was admitted to the hospital today to be induced.

## 2014-05-06 NOTE — Anesthesia Preprocedure Evaluation (Signed)
Anesthesia Evaluation  Patient identified by MRN, date of birth, ID band Patient awake    Reviewed: Allergy & Precautions, H&P , NPO status , Patient's Chart, lab work & pertinent test results, reviewed documented beta blocker date and time   History of Anesthesia Complications Negative for: history of anesthetic complications  Airway Mallampati: I  TM Distance: >3 FB Neck ROM: full    Dental  (+) Teeth Intact   Pulmonary former smoker,  breath sounds clear to auscultation        Cardiovascular hypertension, Rhythm:regular Rate:Normal     Neuro/Psych negative neurological ROS  negative psych ROS   GI/Hepatic negative GI ROS, Neg liver ROS,   Endo/Other  Morbid obesity  Renal/GU negative Renal ROS     Musculoskeletal   Abdominal   Peds  Hematology negative hematology ROS (+)   Anesthesia Other Findings   Reproductive/Obstetrics (+) Pregnancy                             Anesthesia Physical Anesthesia Plan  ASA: III  Anesthesia Plan: Epidural   Post-op Pain Management:    Induction:   Airway Management Planned:   Additional Equipment:   Intra-op Plan:   Post-operative Plan:   Informed Consent: I have reviewed the patients History and Physical, chart, labs and discussed the procedure including the risks, benefits and alternatives for the proposed anesthesia with the patient or authorized representative who has indicated his/her understanding and acceptance.     Plan Discussed with:   Anesthesia Plan Comments:         Anesthesia Quick Evaluation

## 2014-05-06 NOTE — Progress Notes (Signed)
LABOR PROGRESS NOTE  Wendy Walter is a 28 y.o. G3P2002 at 3216w0d  admitted for induction of labor due to polyhydramnios and chronic hypertension.  Subjective: Comfortable. No complaints.   Objective: BP 147/68 mmHg  Pulse 95  Temp(Src) 97.6 F (36.4 C) (Oral)  Resp 14  Ht 5\' 6"  (1.676 m)  Wt 287 lb 12.8 oz (130.545 kg)  BMI 46.47 kg/m2  LMP 09/13/2013 or  Filed Vitals:   05/06/14 1106 05/06/14 1143 05/06/14 1223 05/06/14 1336  BP: 143/80  142/81 147/68  Pulse: 97  109 95  Temp:   97.6 F (36.4 C)   TempSrc:   Oral   Resp: 20 20 20 14   Height:      Weight:           FHT:  FHR: 150 bpm, variability: moderate,  accelerations:  Present,  decelerations:  Absent UC:   irregular, every 2-3 minutes SVE:   Dilation: 4 Effacement (%): 50 Station: -1 Exam by:: Wendy Walter   Dilation: 4 Effacement (%): 50 Cervical Position: Middle Station: -1 Presentation: Vertex Exam by:: Wendy Walter   Pitocin @ 16 mu/min  Labs: Lab Results  Component Value Date   WBC 11.4* 05/06/2014   HGB 11.2* 05/06/2014   HCT 33.7* 05/06/2014   MCV 79.7 05/06/2014   PLT 241 05/06/2014    Assessment / Plan: Induction of labor due to polyhydramnios and chronic hypertension, early induction  Labor: Early induction. Continue to increase pitocin per protocol. Will consider AROM for further augmentation if minimal change at next check.   Chronic Hypertension: No medications. Blood pressure normal to mild range. Continue to monitor.  Fetal Wellbeing:  Category I Pain Control:  Epidural - planning Anticipated MOD:  NSVD  Wendy Walter, Wendy Hartney, MD 05/06/2014, 2:58 PM

## 2014-05-06 NOTE — Anesthesia Procedure Notes (Signed)
Epidural Patient location during procedure: OB Start time: 05/06/2014 5:55 PM  Staffing Anesthesiologist: Ericson Nafziger Performed by: anesthesiologist   Preanesthetic Checklist Completed: patient identified, site marked, surgical consent, pre-op evaluation, timeout performed, IV checked, risks and benefits discussed and monitors and equipment checked  Epidural Patient position: sitting Prep: site prepped and draped and DuraPrep Patient monitoring: continuous pulse ox and blood pressure Approach: midline Location: L3-L4 Injection technique: LOR air  Needle:  Needle type: Tuohy  Needle gauge: 17 G Needle length: 9 cm and 9 Needle insertion depth: 7.5 cm Catheter type: closed end flexible Catheter size: 19 Gauge Catheter at skin depth: 12.5 cm Test dose: negative  Assessment Events: blood not aspirated, injection not painful, no injection resistance, negative IV test and no paresthesia  Additional Notes Discussed risk of headache, infection, bleeding, nerve injury and failed or incomplete block.  Patient voices understanding and wishes to proceed.  Epidural placed easily on first attempt.  No paresthesia.  Patient tolerated procedure well with no apparent complications.  Jasmine DecemberA. Malkia Nippert, MDReason for block:procedure for pain

## 2014-05-06 NOTE — H&P (Signed)
LABOR ADMISSION HISTORY AND PHYSICAL  Wendy SchlatterLeslie Romm is a 28 y.o. female G3P2002 with IUP at 6262w0d by redating 22w sono presenting for IOL 2/2 cHTN and polyhydramnios. She reports +FMs, No LOF, no VB, no blurry vision, headaches or peripheral edema, and RUQ pain.   Dating: By redating 7584w0d sono --->  Estimated Date of Delivery: 05/13/14  [facility]  Dating 7 wk u/s  Genetic Screen declined  Anatomic US WNL  GTT Early:     109          Third trimester: 125  TDaP vaccine 02/19/14  Flu vaccine Sept 8,2015  GBS Negative  Contraception Mirena  Baby Food  breast  Circumcision  yes  Pediatrician  [facility]  Support Person  Trinna Postlex, husband    Prenatal History/Complications:  Past Medical History: Past Medical History  Diagnosis Date  . Hypertension     Past Surgical History: Past Surgical History  Procedure Laterality Date  . Wisdom tooth extraction      Obstetrical History: OB History    Gravida Para Term Preterm AB TAB SAB Ectopic Multiple Living   3 2 2       2      Social History: History   Social History  . Marital Status: Married    Spouse Name: N/A    Number of Children: N/A  . Years of Education: N/A   Social History Main Topics  . Smoking status: Former Smoker    Quit date: 10/16/2005  . Smokeless tobacco: Never Used  . Alcohol Use: No  . Drug Use: No  . Sexual Activity: Yes    Birth Control/ Protection: None   Other Topics Concern  . None   Social History Narrative    Family History: Family History  Problem Relation Age of Onset  . Diabetes Father   . Hypertension Maternal Grandmother   . Hypertension Paternal Grandmother     Allergies: No Known Allergies  Prescriptions prior to admission  Medication Sig Dispense Refill Last Dose  . aspirin (BABY ASPIRIN) 81 MG chewable tablet Chew 1 tablet (81 mg total) by mouth daily. 90 tablet 3 05/05/2014 at Unknown time  . Prenatal Multivit-Min-Fe-FA (PRENATAL VITAMINS) 0.8 MG tablet Take  1 tablet by mouth daily. 90 tablet 3 05/05/2014 at Unknown time     Review of Systems   All systems reviewed and negative except as stated in HPI  Blood pressure 148/82, pulse 146, temperature 98.3 F (36.8 C), temperature source Oral, resp. rate 18, height 5\' 6"  (1.676 m), weight 287 lb 12.8 oz (130.545 kg), last menstrual period 09/13/2013. General appearance: alert and cooperative Lungs: clear to auscultation bilaterally Heart: regular rate and rhythm Abdomen: soft, non-tender; bowel sounds normal Pelvic: adequate Extremities: Homans sign is negative, no sign of DVT Presentation: cephalic Fetal monitoringBaseline: 150 bpm, Variability: Good {> 6 bpm), Accelerations: Reactive and Decelerations: Absent Uterine activityNone Dilation: 3 Effacement (%): 60 Station: -1 Exam by:: Loreta AveAcosta   Prenatal labs: ABO, Rh: B/POS/-- (06/01 1021) Antibody: NEG (06/01 1021) Rubella:   RPR: NON REAC (09/29 1612)  HBsAg: NEGATIVE (06/01 1021)  HIV: NONREACTIVE (09/29 1612)  GBS: Negative (11/23 0000)  1 hr Glucola 109, 125 Genetic screening  declined Anatomy US polyhydramnios, otherwise wnl    Results for orders placed or performed during the hospital encounter of 05/06/14 (from the past 24 hour(s))  CBC   Collection Time: 05/06/14  7:53 AM  Result Value Ref Range   WBC 11.4 (H) 4.0 - 10.5  K/uL   RBC 4.23 3.87 - 5.11 MIL/uL   Hemoglobin 11.2 (L) 12.0 - 15.0 g/dL   HCT 95.633.7 (L) 21.336.0 - 08.646.0 %   MCV 79.7 78.0 - 100.0 fL   MCH 26.5 26.0 - 34.0 pg   MCHC 33.2 30.0 - 36.0 g/dL   RDW 57.813.3 46.911.5 - 62.915.5 %   Platelets 241 150 - 400 K/uL    Patient Active Problem List   Diagnosis Date Noted  . Polyhydramnios 05/06/2014  . NST (non-stress test) nonreactive   . [redacted] weeks gestation of pregnancy   . Hx of preeclampsia, prior pregnancy, currently pregnant   . Polyhydramnios in third trimester   . Polyhydramnios, third trimester, antepartum condition or complication 02/26/2014  . Rubella  non-immune status, antepartum 10/23/2013  . Supervision of high-risk pregnancy 10/16/2013  . History of pre-eclampsia in prior pregnancy, currently pregnant 10/16/2013  . Obesity complicating peripregnancy, antepartum 10/16/2013  . Benign essential hypertension antepartum 10/16/2013    Assessment: Wendy Walter is a 28 y.o. G3P2002 at 10727w0d here for IOL 2/2 cHTN and polyhydramnios  #Labor: start pitocin, cervix favorable #Pain: None at this time, may have epidural upon request #FWB: Cat I #ID:  GBS neg #MOF: breast #MOC:mirena #Circ:  Yes if boy, do not know sex of baby  Kingdavid Leinbach ROCIO 05/06/2014, 9:08 AM

## 2014-05-07 MED ORDER — IBUPROFEN 600 MG PO TABS: 600.0000 mg | ORAL_TABLET | ORAL | Status: DC | PRN

## 2014-05-07 NOTE — Lactation Note (Signed)
This note was copied from the chart of Girl Garth SchlatterLeslie Sassano. Lactation Consultation Note: Initial visit with this experienced BF mom. She reports that baby has been nursing great- latching well with no pain. Baby asleep at present. BF brochure given with resources for support after DC. Mom for DC later this afternoon. No questions at present. To call rpn  Patient Name: Girl Garth SchlatterLeslie Todaro ZOXWR'UToday's Date: 05/07/2014 Reason for consult: Initial assessment   Maternal Data Formula Feeding for Exclusion: No Has patient been taught Hand Expression?: Yes Does the patient have breastfeeding experience prior to this delivery?: Yes  Feeding Feeding Type: Breast Fed  LATCH Score/Interventions Latch: Grasps breast easily, tongue down, lips flanged, rhythmical sucking.  Audible Swallowing: A few with stimulation  Type of Nipple: Everted at rest and after stimulation  Comfort (Breast/Nipple): Soft / non-tender     Hold (Positioning): No assistance needed to correctly position infant at breast.  LATCH Score: 9  Lactation Tools Discussed/Used     Consult Status Consult Status: Complete    Pamelia HoitWeeks, Phillippe Orlick D 05/07/2014, 3:43 PM

## 2014-05-07 NOTE — Discharge Instructions (Signed)

## 2014-05-07 NOTE — Discharge Summary (Signed)
Obstetric Discharge Summary Reason for Admission: induction of labor 2/2 cHTN and polyhydramnios Prenatal Procedures: none Intrapartum Procedures: spontaneous vaginal delivery Postpartum Procedures: none Complications-Operative and Postpartum: none HEMOGLOBIN  Date Value Ref Range Status  05/06/2014 11.0* 12.0 - 15.0 g/dL Final   HCT  Date Value Ref Range Status  05/06/2014 33.6* 36.0 - 46.0 % Final   Hospital Course: Wendy Walter is a 28 y.o. G3P3003 who presented for IOL 2/2 cHTN and polyhydramnios.  She was induced with pitocin and progressed well through labor.  At 3481w0d, she delivered viable female via SVD.  On PPD#1, doing well without complaints or complications and ready for discharge.  Breastfeeding and desires mirena for contraception.  Physical Exam:  General: alert, cooperative and no distress Lochia: appropriate Uterine Fundus: firm Incision: n/a DVT Evaluation: No evidence of DVT seen on physical exam.  Discharge Diagnoses: Term Pregnancy-delivered  Discharge Information: Date: 05/07/2014 Activity: pelvic rest Diet: routine Medications: PNV and Ibuprofen Condition: stable Instructions: refer to practice specific booklet Discharge to: home Follow-up Information    [facility]. Schedule an appointment as soon as possible for a visit in 6 weeks.   Specialty:  Obstetrics and Gynecology   Why:  For post-partum visit   Contact information:   [address] [place] [phone] [phone]      [facility] with [facility].   Why:  As needed for emergencies   Contact information:   [address] [id number] mc [place] [id number] [phone]      Newborn Data: Live born female  Birth Weight: 9 lb 4.5 oz (4210 g) APGAR: 8, 9  Home with mother.  Shirlee LatchBacigalupo, Wendy 05/07/2014, 6:51 AM    OB fellow attestation Post  Partum Day 1 I have seen and examined this patient and agree with above documentation in the resident's note.   Wendy Walter is a 28 y.o. G3P3003 s/p NSVD.  Pt denies problems with ambulating, voiding or po intake. Pain is well controlled.  Plan for birth control is IUD.  Method of Feeding: breast  PE:  BP 136/74 mmHg  Pulse 82  Temp(Src) 98 F (36.7 C) (Oral)  Resp 18  Ht 5\' 6"  (1.676 m)  Wt 287 lb 12.8 oz (130.545 kg)  BMI 46.47 kg/m2  LMP 09/13/2013  Breastfeeding? Unknown Fundus firm  Plan for discharge: Today, pending peds  William DaltonMcEachern, Everet Flagg, MD 8:44 AM

## 2014-05-07 NOTE — Anesthesia Postprocedure Evaluation (Signed)
Anesthesia Post Note  Patient: Wendy SchlatterLeslie Walter  Procedure(s) Performed: * No procedures listed *  Anesthesia type: Epidural  Patient location: Mother/Baby  Post pain: Pain level controlled  Post assessment: Post-op Vital signs reviewed  Last Vitals:  Filed Vitals:   05/07/14 0628  BP: 136/74  Pulse: 82  Temp: 36.7 C  Resp: 18    Post vital signs: Reviewed  Level of consciousness:alert  Complications: No apparent anesthesia complications

## 2014-06-28 ENCOUNTER — Ambulatory Visit (INDEPENDENT_AMBULATORY_CARE_PROVIDER_SITE_OTHER): Payer: BC Managed Care – PPO | Admitting: Family Medicine

## 2014-06-28 ENCOUNTER — Encounter: Payer: Self-pay | Admitting: Family Medicine

## 2014-06-28 VITALS — BP 131/84 | HR 95 | Wt 269.0 lb

## 2014-06-28 DIAGNOSIS — Z30430 Encounter for insertion of intrauterine contraceptive device: Secondary | ICD-10-CM

## 2014-06-28 DIAGNOSIS — Z01812 Encounter for preprocedural laboratory examination: Secondary | ICD-10-CM | POA: Diagnosis not present

## 2014-06-28 LAB — POCT URINE PREGNANCY: PREG TEST UR: NEGATIVE

## 2014-06-28 NOTE — Progress Notes (Signed)
  Subjective:     Wendy SchlatterLeslie Walter is a 29 y.o. female who presents for a postpartum visit. She is 8 weeks postpartum following a spontaneous vaginal delivery. I have fully reviewed the prenatal and intrapartum course. The delivery was at 39 gestational weeks. Outcome: spontaneous vaginal delivery. Anesthesia: epidural. Postpartum course has been unremarkable. Baby's course has been normal. Baby is feeding by breast. Bleeding no bleeding. Bowel function is normal. Bladder function is normal. Patient is sexually active. Contraception method is condoms. Postpartum depression screening: negative.  The following portions of the patient's history were reviewed and updated as appropriate: allergies, current medications, past family history, past medical history, past social history, past surgical history and problem list.  Review of Systems Pertinent items are noted in HPI.   Objective:    BP 131/84 mmHg  Pulse 95  Wt 269 lb (122.018 kg)  Breastfeeding? Yes  General:  alert, cooperative and appears stated age   Breasts:  inspection negative, no nipple discharge or bleeding, no masses or nodularity palpable   Vulva:  normal  Vagina: normal vagina  Cervix:  multiparous appearance and no cervical motion tenderness  Corpus: normal size, contour, position, consistency, mobility, non-tender  Adnexa:  normal adnexa     Procedure: Patient identified, informed consent performed, signed copy in chart, time out was performed.  Urine pregnancy test negative.  Speculum placed in the vagina.  Cervix visualized.  Cleaned with Betadine x 2.  Grasped anteriourly with a single tooth tenaculum.  Uterus sounded to 10 cm.  Mirena IUD placed per manufacturer's recommendations.  Strings trimmed to 3 cm.   Patient given post procedure instructions and Mirena care card with expiration date.  Patient is asked to check IUD strings periodically.  Assessment:     8 wk postpartum exam. Pap smear not done at today's visit.    Plan:    1. Contraception: IUD 2. Pap due 2017 3. Follow up in: 1 month or as needed.

## 2014-06-28 NOTE — Patient Instructions (Signed)
Levonorgestrel intrauterine device (IUD) What is this medicine? LEVONORGESTREL IUD (LEE voe nor jes trel) is a contraceptive (birth control) device. The device is placed inside the uterus by a healthcare professional. It is used to prevent pregnancy and can also be used to treat heavy bleeding that occurs during your period. Depending on the device, it can be used for 3 to 5 years. This medicine may be used for other purposes; ask your health care provider or pharmacist if you have questions. COMMON BRAND NAME(S): LILETTA, Mirena, Skyla What should I tell my health care provider before I take this medicine? They need to know if you have any of these conditions: -abnormal Pap smear -cancer of the breast, uterus, or cervix -diabetes -endometritis -genital or pelvic infection now or in the past -have more than one sexual partner or your partner has more than one partner -heart disease -history of an ectopic or tubal pregnancy -immune system problems -IUD in place -liver disease or tumor -problems with blood clots or take blood-thinners -use intravenous drugs -uterus of unusual shape -vaginal bleeding that has not been explained -an unusual or allergic reaction to levonorgestrel, other hormones, silicone, or polyethylene, medicines, foods, dyes, or preservatives -pregnant or trying to get pregnant -breast-feeding How should I use this medicine? This device is placed inside the uterus by a health care professional. Talk to your pediatrician regarding the use of this medicine in children. Special care may be needed. Overdosage: If you think you have taken too much of this medicine contact a poison control center or emergency room at once. NOTE: This medicine is only for you. Do not share this medicine with others. What if I miss a dose? This does not apply. What may interact with this medicine? Do not take this medicine with any of the following  medications: -amprenavir -bosentan -fosamprenavir This medicine may also interact with the following medications: -aprepitant -barbiturate medicines for inducing sleep or treating seizures -bexarotene -griseofulvin -medicines to treat seizures like carbamazepine, ethotoin, felbamate, oxcarbazepine, phenytoin, topiramate -modafinil -pioglitazone -rifabutin -rifampin -rifapentine -some medicines to treat HIV infection like atazanavir, indinavir, lopinavir, nelfinavir, tipranavir, ritonavir -St. John's wort -warfarin This list may not describe all possible interactions. Give your health care provider a list of all the medicines, herbs, non-prescription drugs, or dietary supplements you use. Also tell them if you smoke, drink alcohol, or use illegal drugs. Some items may interact with your medicine. What should I watch for while using this medicine? Visit your doctor or health care professional for regular check ups. See your doctor if you or your partner has sexual contact with others, becomes HIV positive, or gets a sexual transmitted disease. This product does not protect you against HIV infection (AIDS) or other sexually transmitted diseases. You can check the placement of the IUD yourself by reaching up to the top of your vagina with clean fingers to feel the threads. Do not pull on the threads. It is a good habit to check placement after each menstrual period. Call your doctor right away if you feel more of the IUD than just the threads or if you cannot feel the threads at all. The IUD may come out by itself. You may become pregnant if the device comes out. If you notice that the IUD has come out use a backup birth control method like condoms and call your health care provider. Using tampons will not change the position of the IUD and are okay to use during your period. What side effects may   I notice from receiving this medicine? Side effects that you should report to your doctor or  health care professional as soon as possible: -allergic reactions like skin rash, itching or hives, swelling of the face, lips, or tongue -fever, flu-like symptoms -genital sores -high blood pressure -no menstrual period for 6 weeks during use -pain, swelling, warmth in the leg -pelvic pain or tenderness -severe or sudden headache -signs of pregnancy -stomach cramping -sudden shortness of breath -trouble with balance, talking, or walking -unusual vaginal bleeding, discharge -yellowing of the eyes or skin Side effects that usually do not require medical attention (report to your doctor or health care professional if they continue or are bothersome): -acne -breast pain -change in sex drive or performance -changes in weight -cramping, dizziness, or faintness while the device is being inserted -headache -irregular menstrual bleeding within first 3 to 6 months of use -nausea This list may not describe all possible side effects. Call your doctor for medical advice about side effects. You may report side effects to FDA at 1-800-FDA-1088. Where should I keep my medicine? This does not apply. NOTE: This sheet is a summary. It may not cover all possible information. If you have questions about this medicine, talk to your doctor, pharmacist, or health care provider.  2015, Elsevier/Gold Standard. (2011-06-10 13:54:04)  

## 2014-07-01 ENCOUNTER — Ambulatory Visit: Payer: Self-pay | Admitting: Obstetrics and Gynecology

## 2014-07-25 ENCOUNTER — Encounter: Payer: Self-pay | Admitting: Obstetrics & Gynecology

## 2015-11-14 IMAGING — US US FETAL BPP W/O NONSTRESS
1 series · 13 of 13 positions shown · non-contrast
Comparison: none

[Series 1: us fetal bpp w/o nonstress · 0.23mm/px · 13 of 13 slices shown]
[im 1/13]
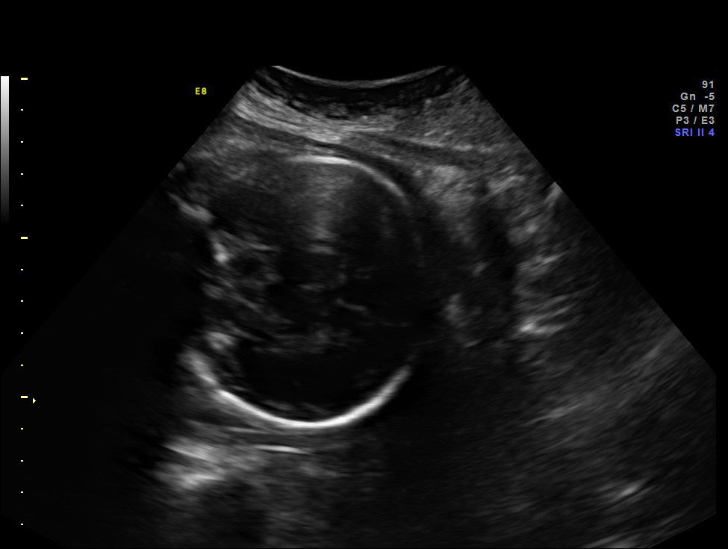
[im 2/13]
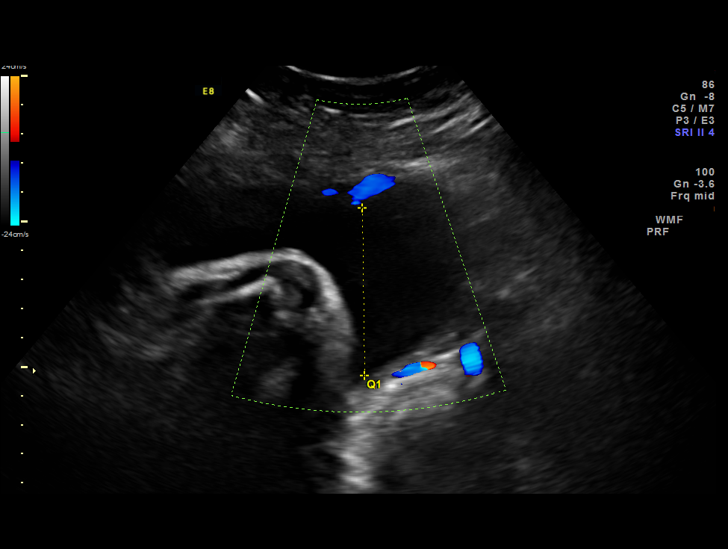
[im 3/13]
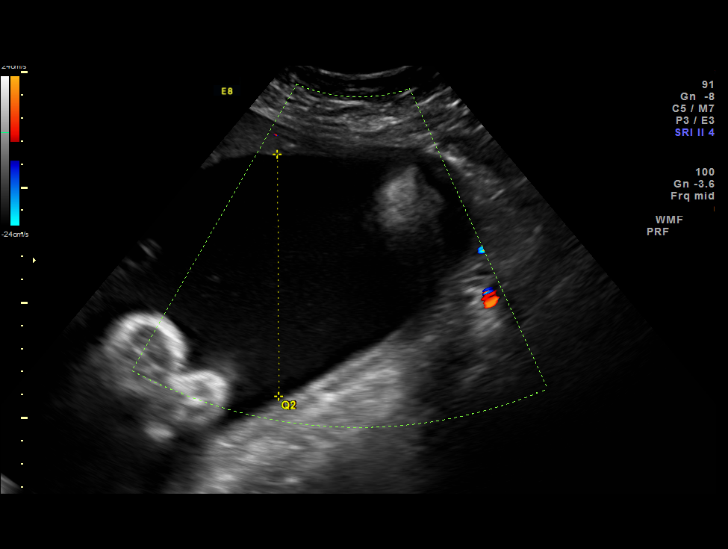
[im 4/13]
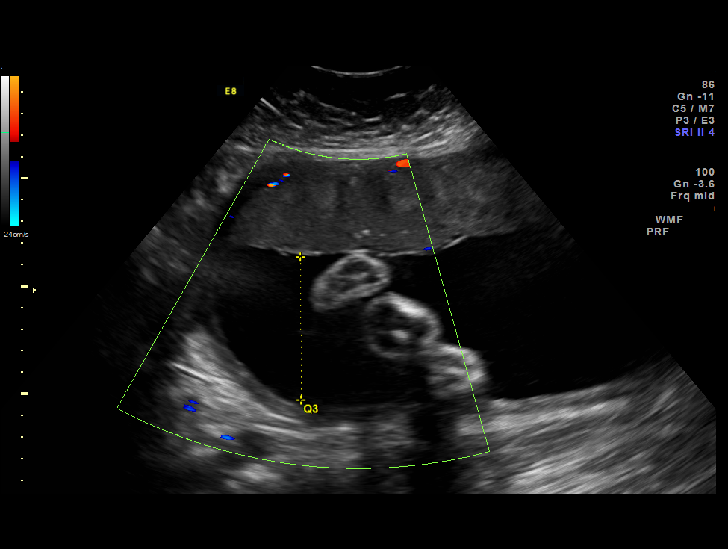
[im 5/13]
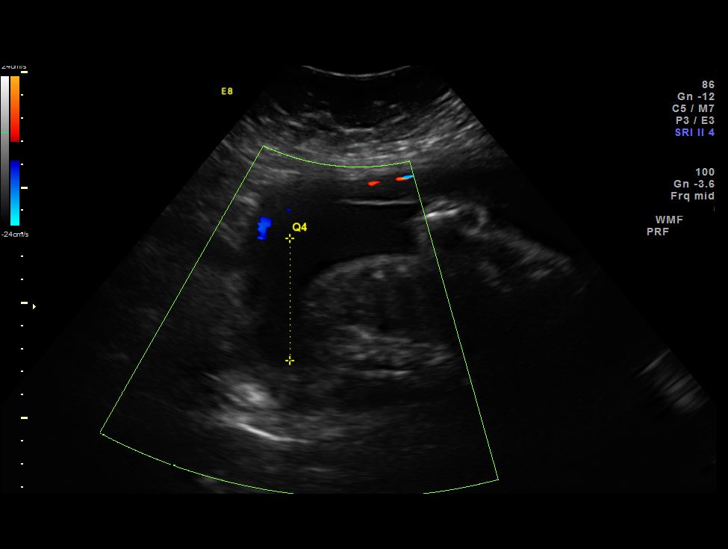
[im 6/13]
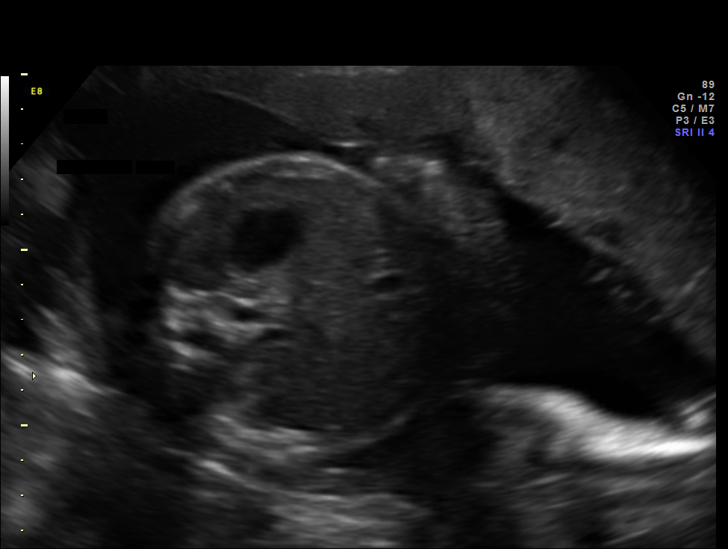
[im 7/13]
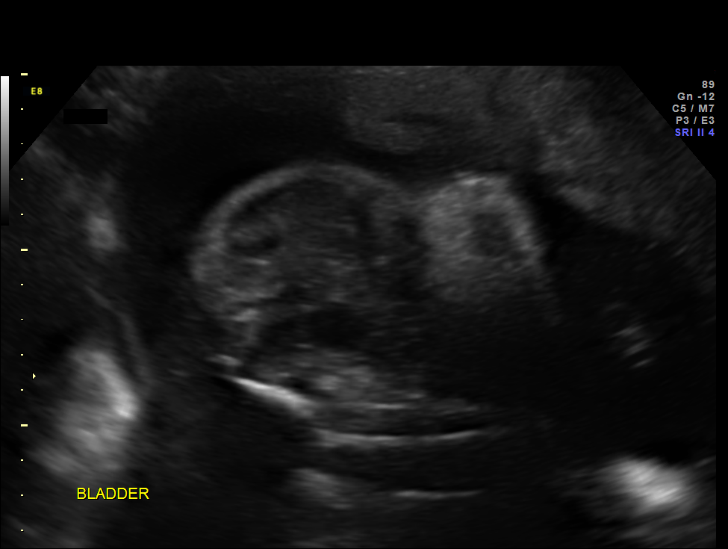
[im 8/13]
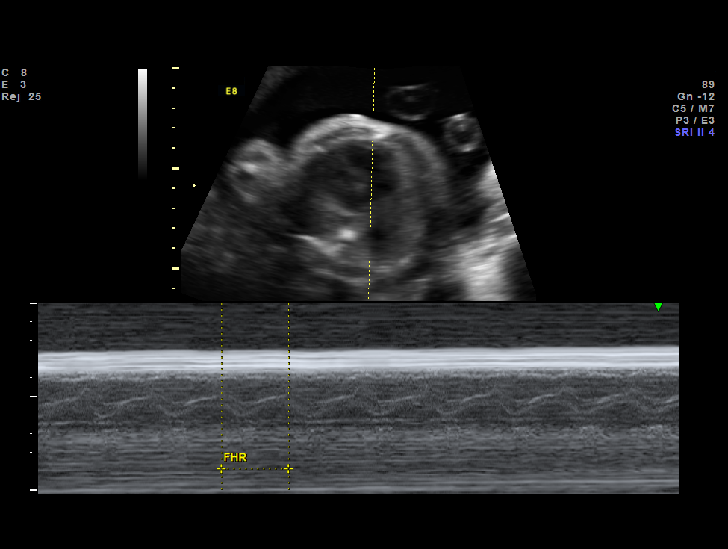
[im 9/13]
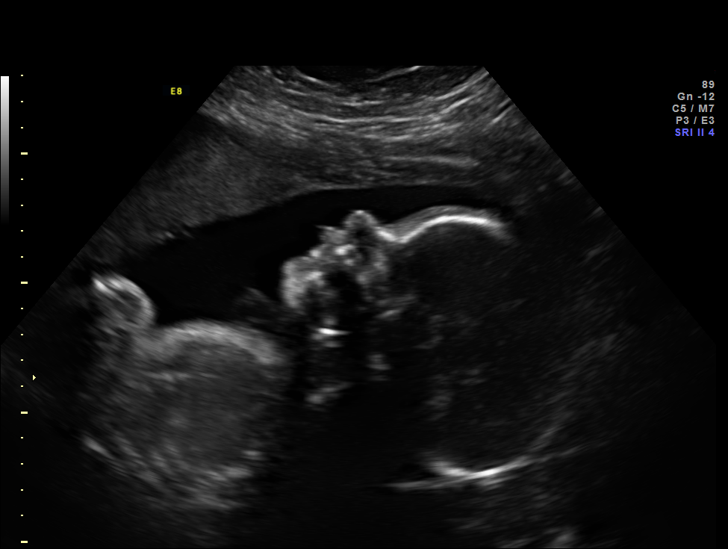
[im 10/13]
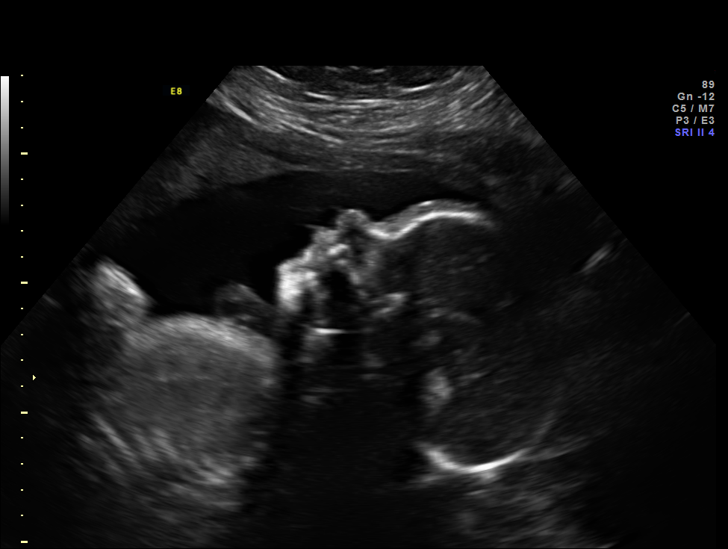
[im 11/13]
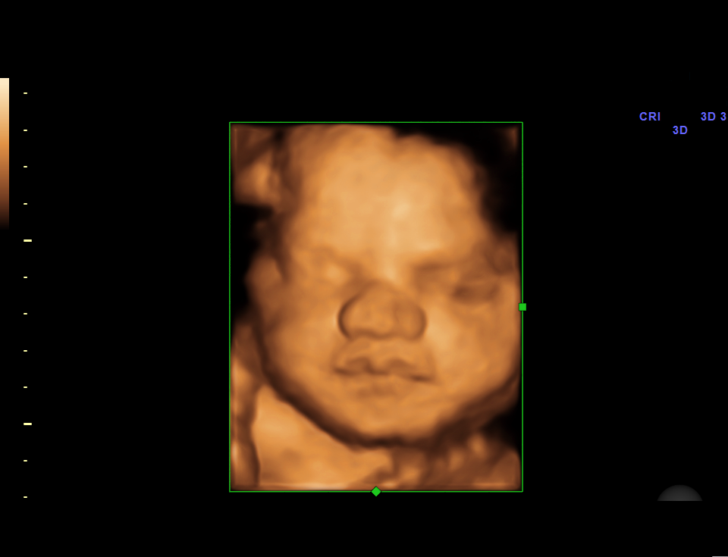
[im 12/13]
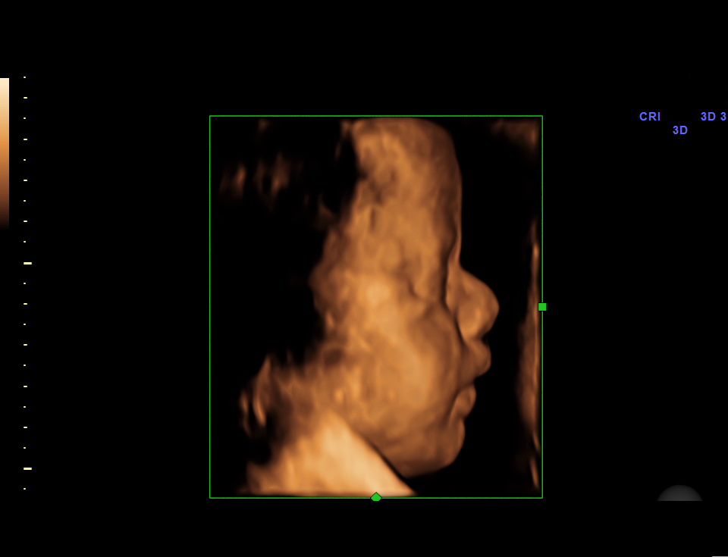
[im 13/13]
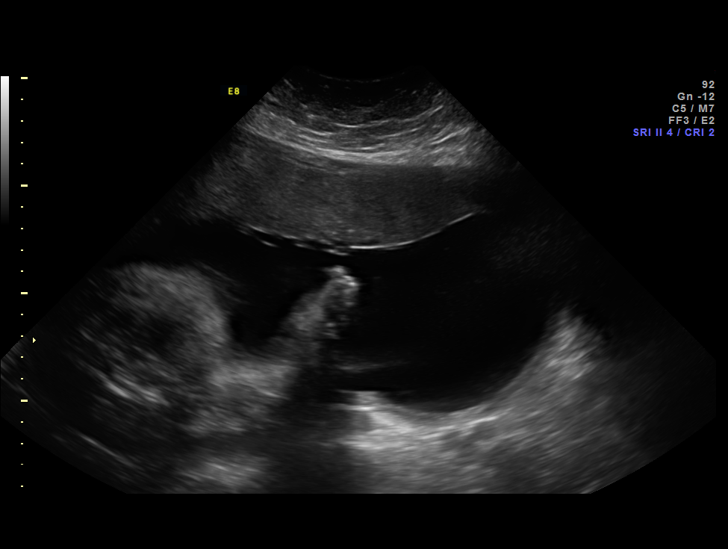

[13 of 13 positions shown; findings below may reference images not displayed]

OBSTETRICS REPORT
                      (Signed Final 03/13/2014 [DATE])

Service(s) Provided

Indications

 Polyhydramnios, third trimester, antepartum
 condition or complication, fetus 1
 Hypertension - Chronic/Pre-existing (no meds)
 Poor obstetric history: Previous preeclampsia /
 eclampsia/gestational HTN
 31 weeks gestation of pregnancy
Fetal Evaluation

 Num Of Fetuses:    1
 Fetal Heart Rate:  150                          bpm
 Cardiac Activity:  Observed
 Presentation:      Cephalic
 Placenta:          Anterior, above cervical os
 P. Cord            Previously Visualized
 Insertion:

 Amniotic Fluid
 AFI FV:      Polyhydramnios
 AFI Sum:     28.22   cm     > 97  %Tile     Larg Pckt:  10.51   cm
 RUQ:   5.77    cm   RLQ:    5.3    cm    LUQ:   10.51   cm   LLQ:    6.64   cm
Biophysical Evaluation

 Amniotic F.V:   Polyhydramnios             F. Tone:        Observed
 F. Movement:    Observed                   Score:          [DATE]
 F. Breathing:   Observed
Gestational Age

 Best:          31w 2d     Det. By:  U/S    (01/07/14)        EDD:   05/13/14
Impression

 Single IUP at 31w 2d
 Polyhydramnios, CHTN
 BPP [DATE]
 Mild polyhydramnios noted with an AFI of 28.22 cm
Recommendations

 Will begin 2x weekly NSTs with weekly AFIs next week.
 Recommend follow up for growth in 2 weeks due to chronic
 hypertension.

 questions or concerns.

## 2015-11-29 IMAGING — US US OB FOLLOW-UP
1 series · 12 of 28 positions shown · non-contrast
Comparison: none

[Series 1: us ob follow-up · 0.26mm/px · 42 acquisitions, 12 frames shown]
[im 2/42]
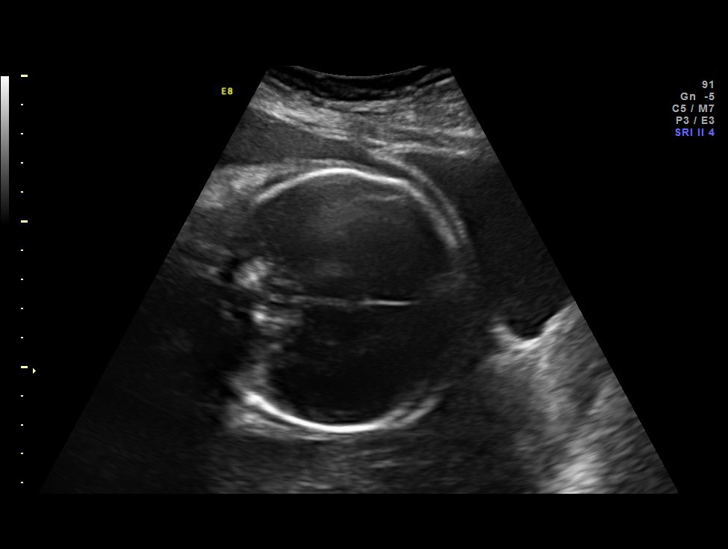
[im 5/42]
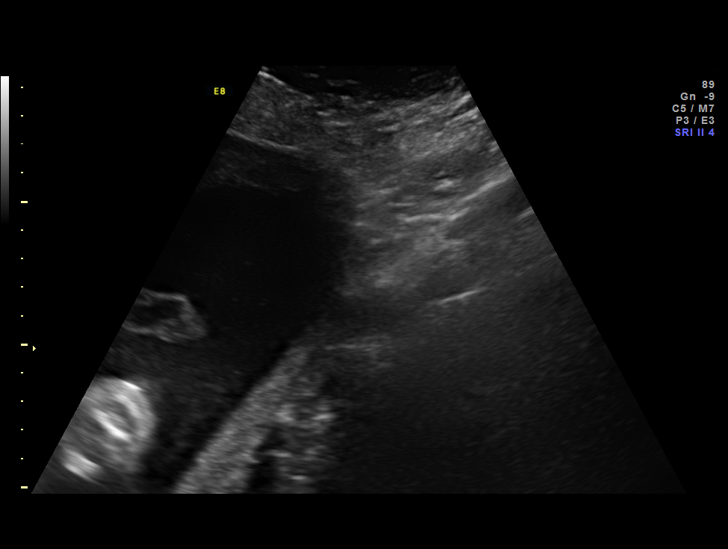
[im 8/42]
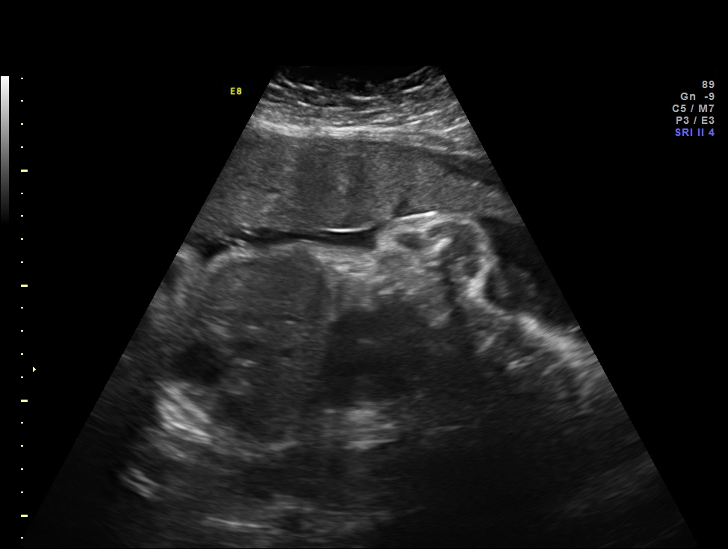
[im 13/42]
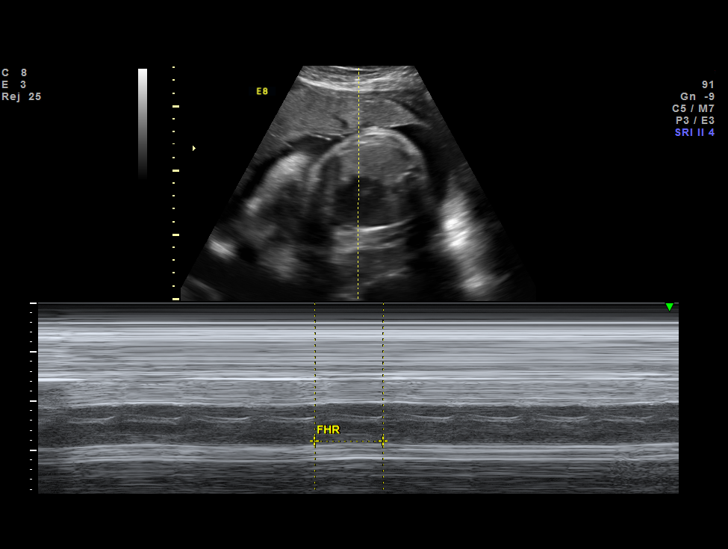
[im 16/42]
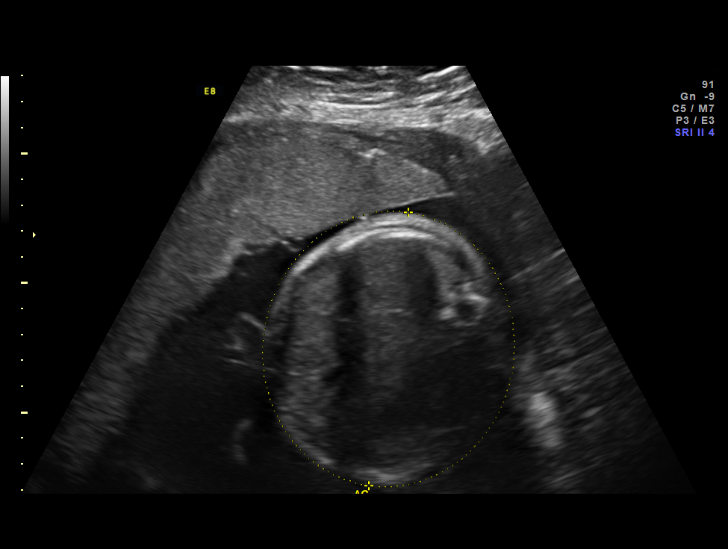
[im 19/42]
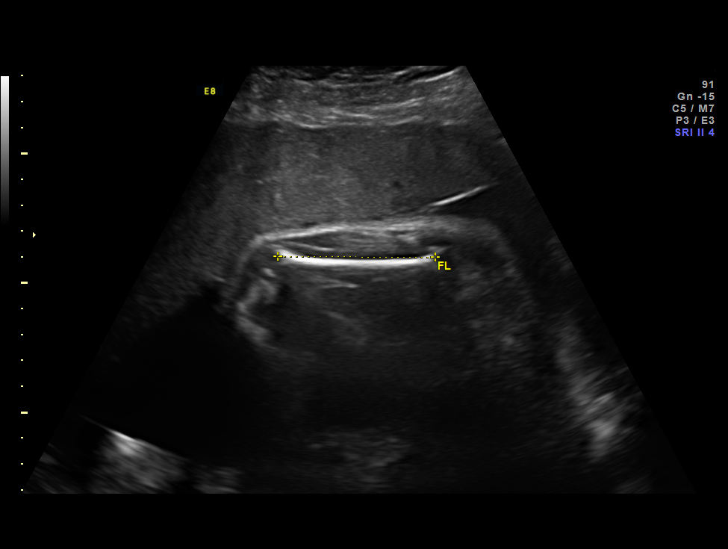
[im 23/42]
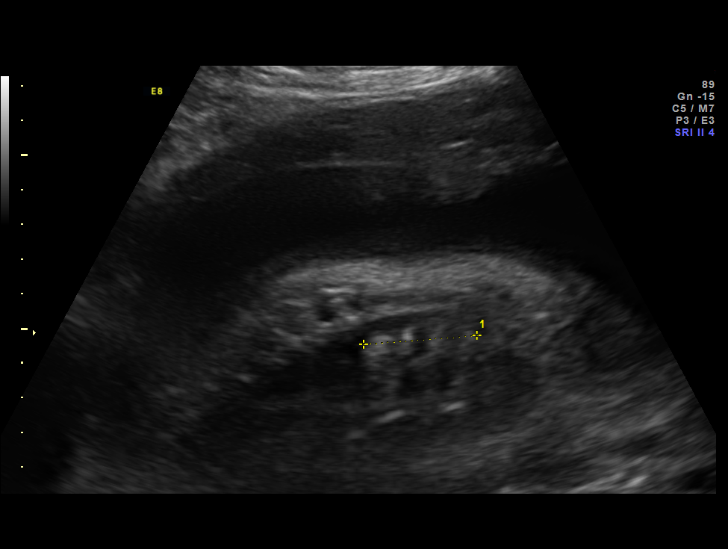
[im 26/42]
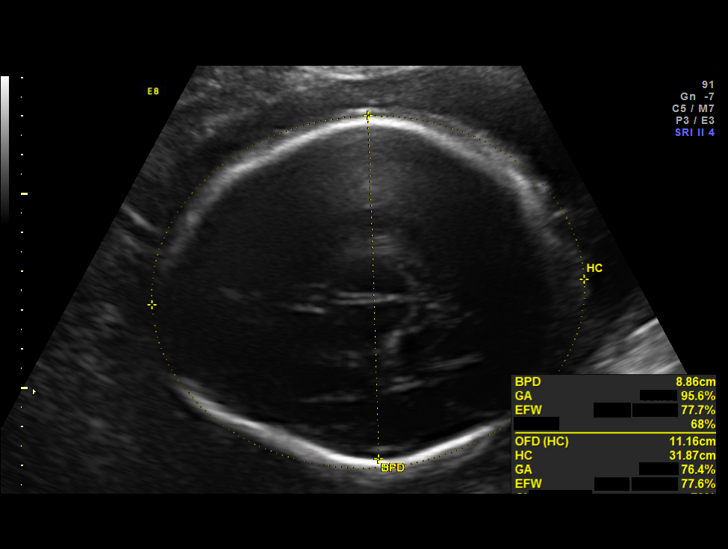
[im 29/42]
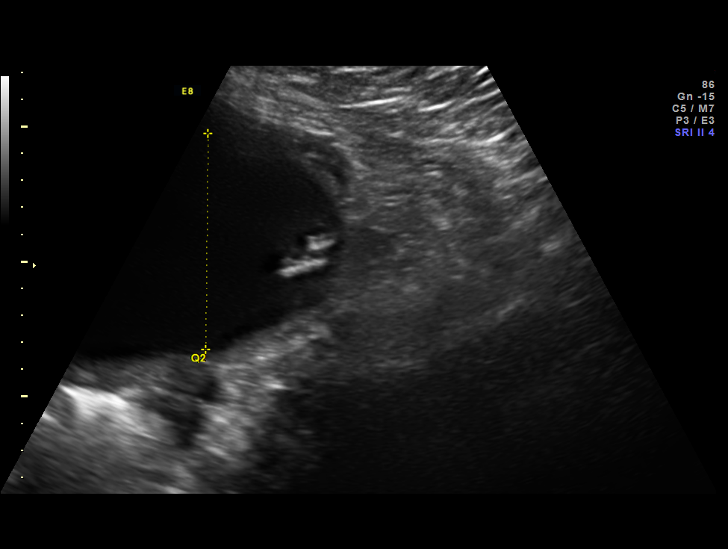
[im 34/42]
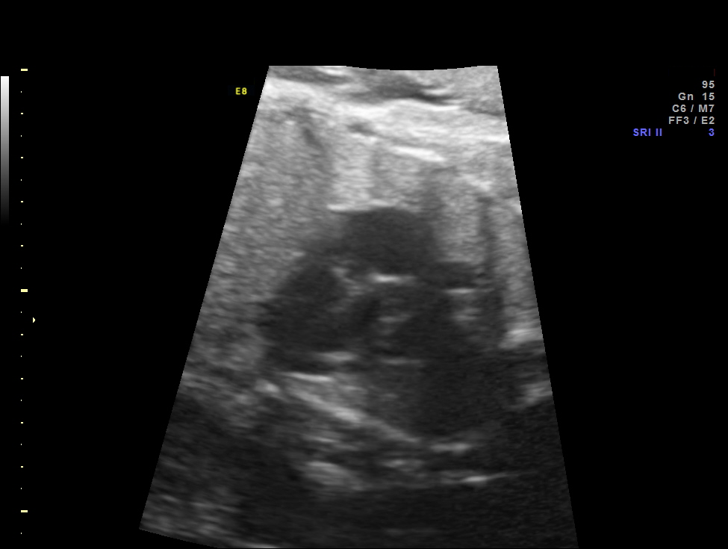
[im 37/42]
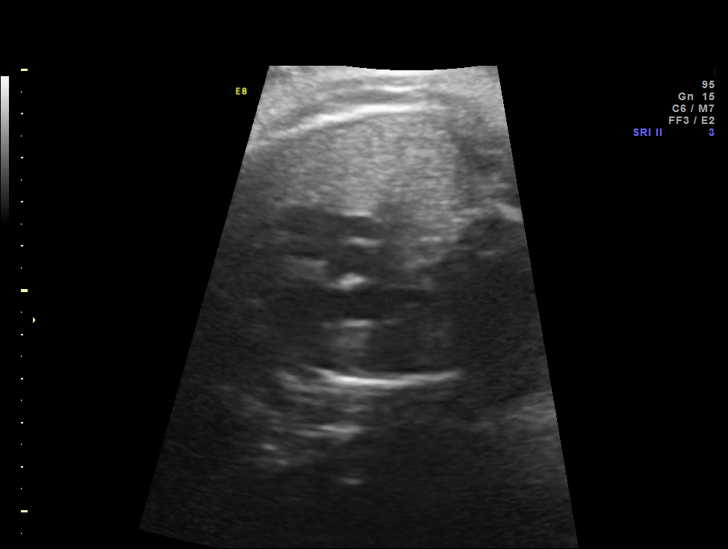
[im 40/42]
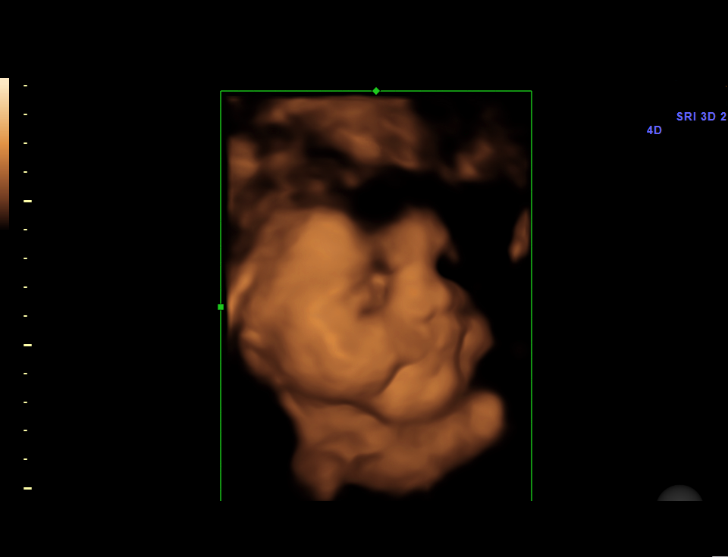

[12 of 28 positions shown; findings below may reference images not displayed]

OBSTETRICS REPORT
                      (Signed Final 03/28/2014 [DATE])

Service(s) Provided

 US OB FOLLOW UP                                       76816.1
Indications

 Polyhydramnios, third trimester, antepartum
 condition or complication, unspecified fetus
 Hypertension - Chronic/Pre-existing (no meds)
 Poor obstetric history: Previous preeclampsia /
 eclampsia/gestational HTN
 33 weeks gestation of pregnancy
 Obesity complicating pregnancy, third trimester
Fetal Evaluation

 Num Of Fetuses:    1
 Fetal Heart Rate:  147                          bpm
 Cardiac Activity:  Observed
 Placenta:          Anterior, above cervical os

 Amniotic Fluid
 AFI FV:      Polyhydramnios
 AFI Sum:     33.59   cm     > 97  %Tile     Larg Pckt:  11.37   cm
 RUQ:   11.37   cm   RLQ:    6.98   cm    LUQ:   8.01    cm   LLQ:    7.23   cm
Biometry

 BPD:     88.3  mm     G. Age:  35w 5d                CI:         80.1   70 - 86
 OFD:    110.3  mm                                    FL/HC:      18.9   19.9 -

 HC:     316.4  mm     G. Age:  35w 4d       68  %    HC/AC:      0.99   0.96 -

 AC:     320.9  mm     G. Age:  36w 0d     > 97  %    FL/BPD:     67.6   71 - 87
 FL:      59.7  mm     G. Age:  31w 1d      < 3  %    FL/AC:      18.6   20 - 24

 Est. FW:    5315  gm      5 lb 8 oz     79  %
Gestational Age

 U/S Today:     34w 4d                                        EDD:   05/05/14
 Best:          33w 3d     Det. By:  U/S    (01/07/14)        EDD:   05/13/14
Anatomy

 Cranium:          Appears normal         Aortic Arch:      Previously seen
 Fetal Cavum:      Previously seen        Ductal Arch:      Previously seen
 Ventricles:       Appears normal         Diaphragm:        Appears normal
 Choroid Plexus:   Previously seen        Stomach:          Appears normal, left
                                                            sided
 Cerebellum:       Previously seen        Abdomen:          Appears normal
 Posterior Fossa:  Previously seen        Abdominal Wall:   Previously seen
 Nuchal Fold:      Previously seen        Cord Vessels:     Previously seen
 Face:             Orbits and profile     Kidneys:          Appear normal
                   previously seen
 Lips:             Previously seen        Bladder:          Appears normal
 Heart:            Previously seen        Spine:            Previously seen
 RVOT:             Previously seen        Lower             Previously seen
                                          Extremities:
 LVOT:             Previously seen        Upper             Previously seen
                                          Extremities:

 Other:  Parents do not wish to know sex of fetus. Heels and 5th digit prev.
         visualized. Nasal bone prev. visualized.
Cervix Uterus Adnexa

 Cervix:       Not visualized (advanced GA >91wks)
 Uterus:       No abnormality visualized.
 Cul De Sac:   No free fluid seen.

 Adnexa:     No abnormality visualized.
Impression

 SIUP at 33+3 weeks
 Normal interval anatomy; anatomic survey complete
 Mild polyhydramnios
 EFW at the 79th %tile; AC > 97th %tile
 NST reactive

 questions or concerns.

## 2015-12-21 IMAGING — US US OB LIMITED
1 series · 13 of 14 positions shown · non-contrast
Comparison: none

[Series 1: us ob limited · 0.28mm/px · 13 of 14 slices shown]
[im 1/14]
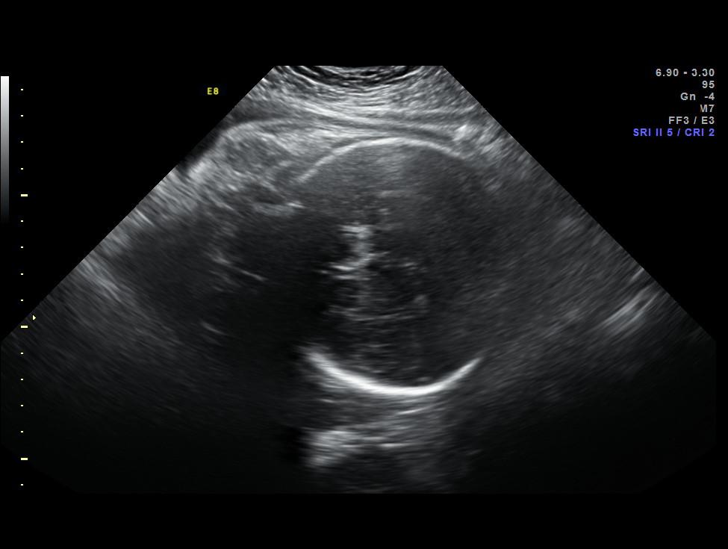
[im 2/14]
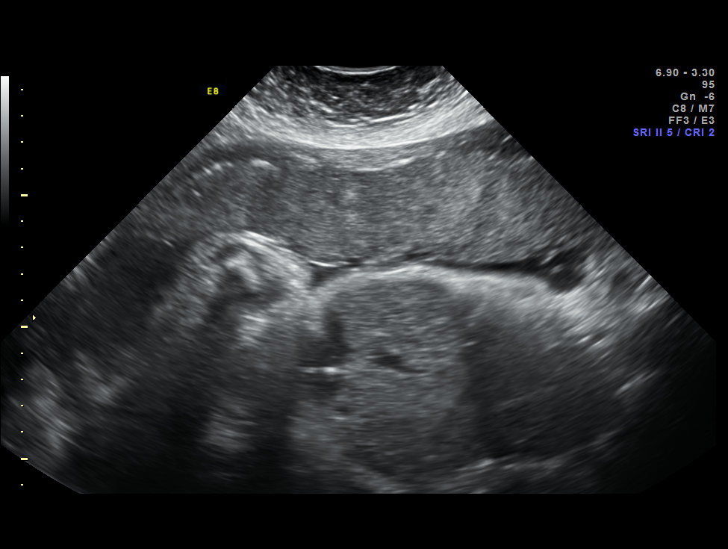
[im 3/14]
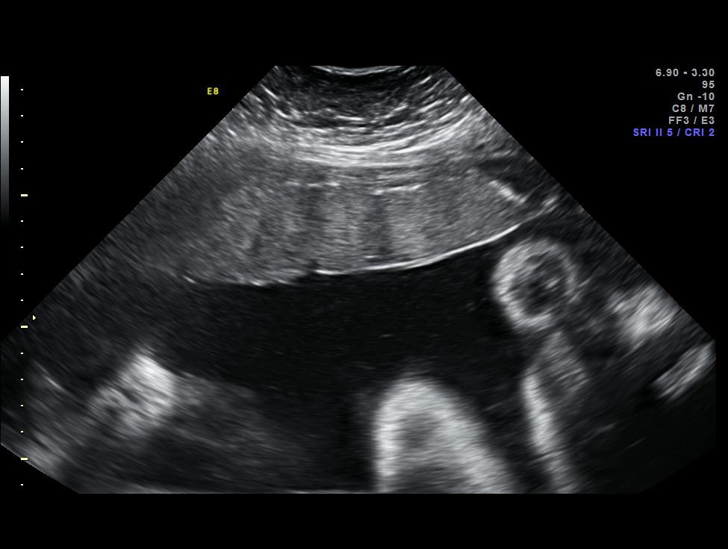
[im 4/14]
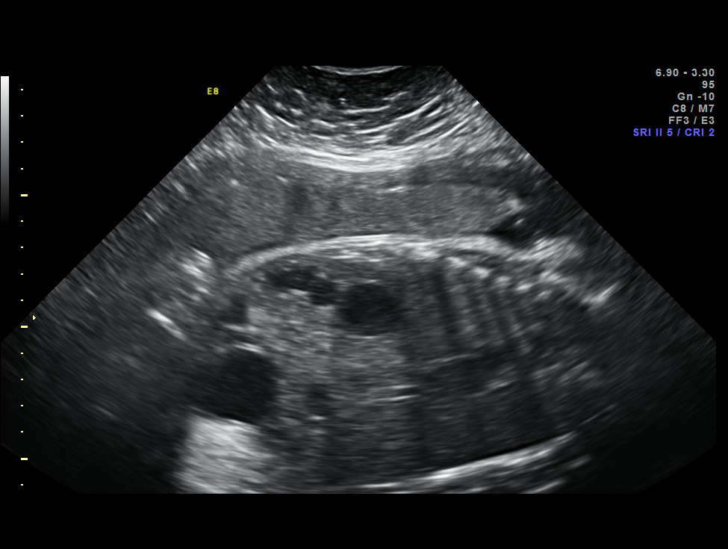
[im 5/14]
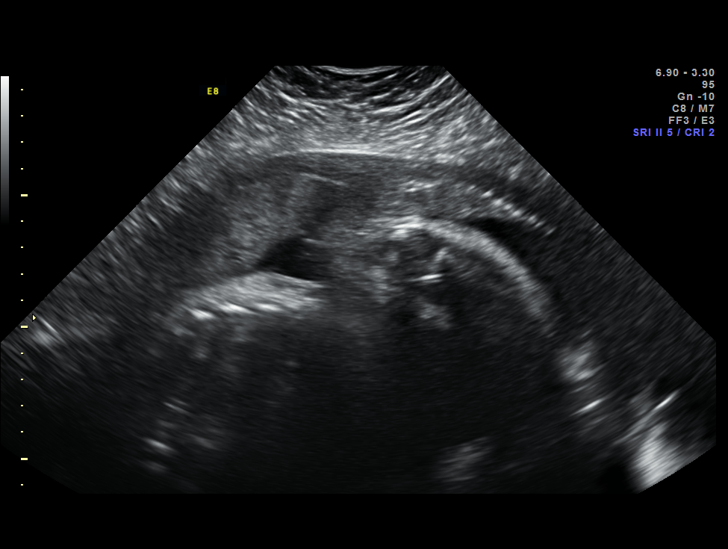
[im 6/14]
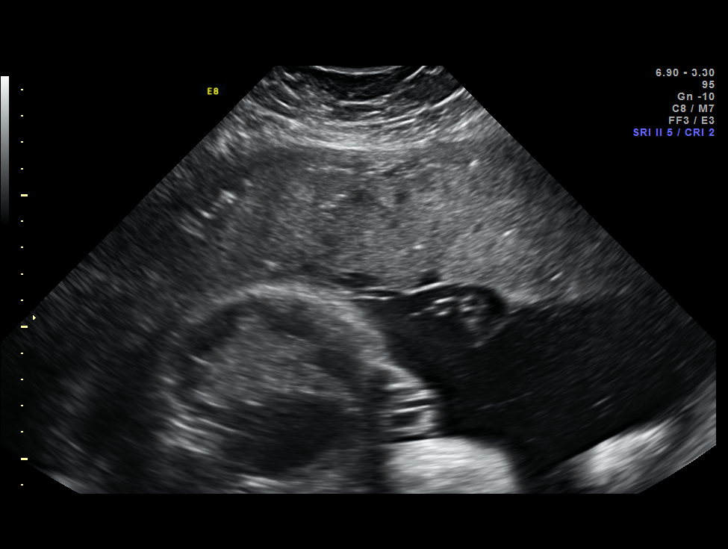
[im 8/14]
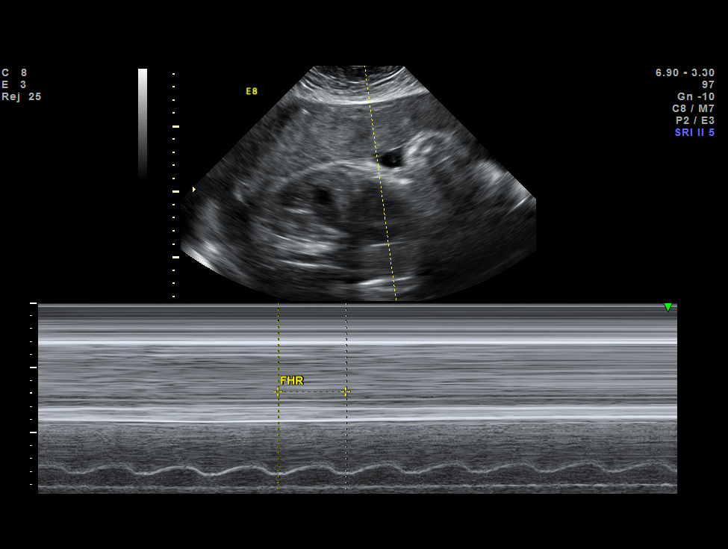
[im 9/14]
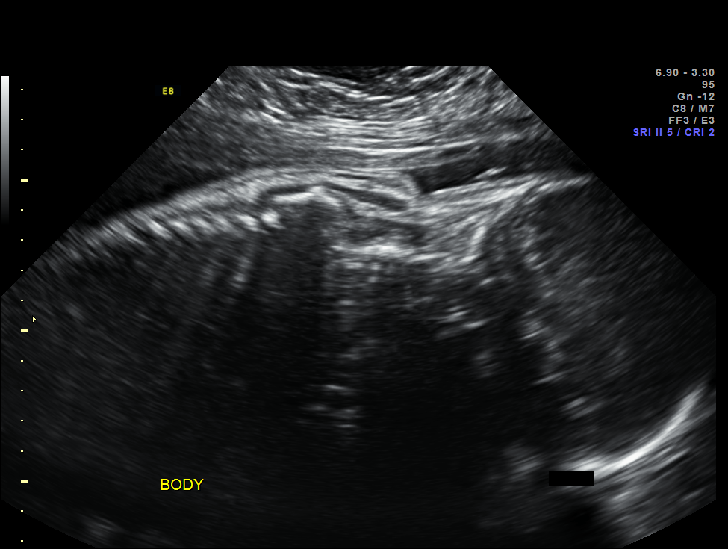
[im 10/14]
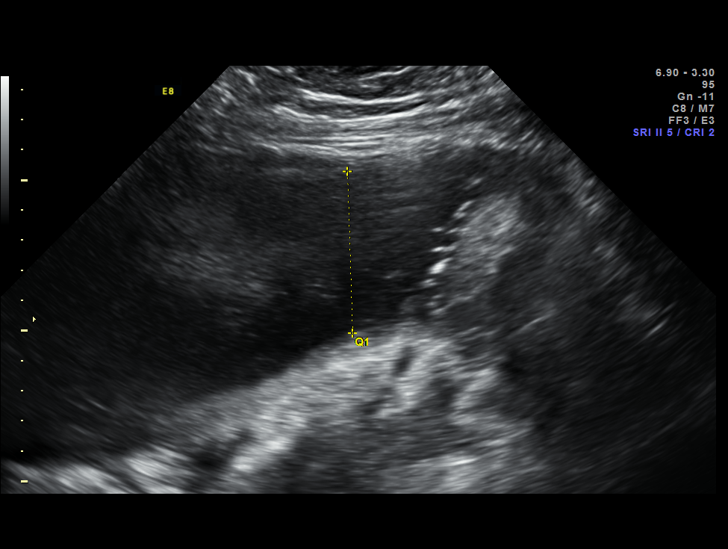
[im 11/14]
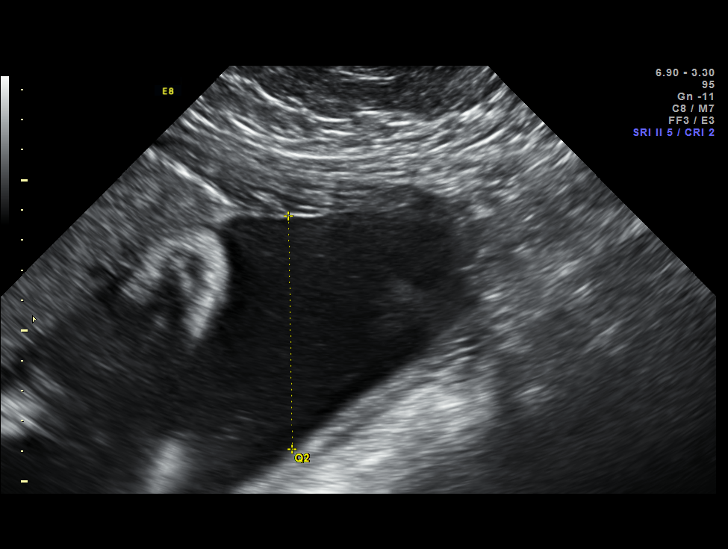
[im 12/14]
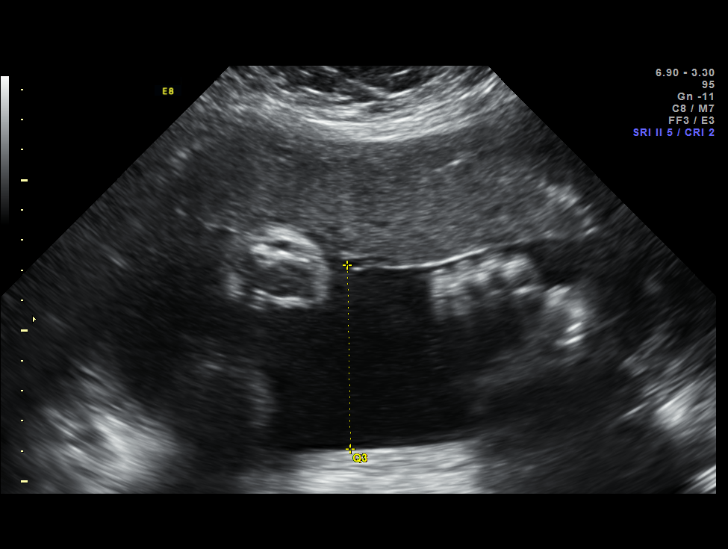
[im 13/14]
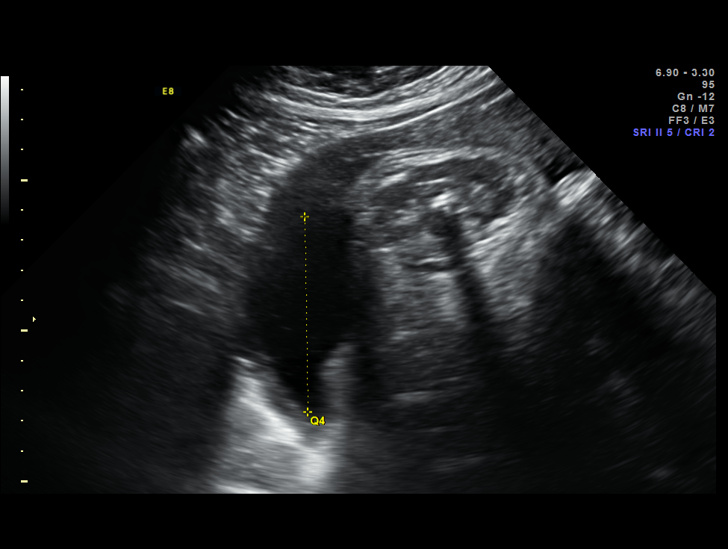
[im 14/14]
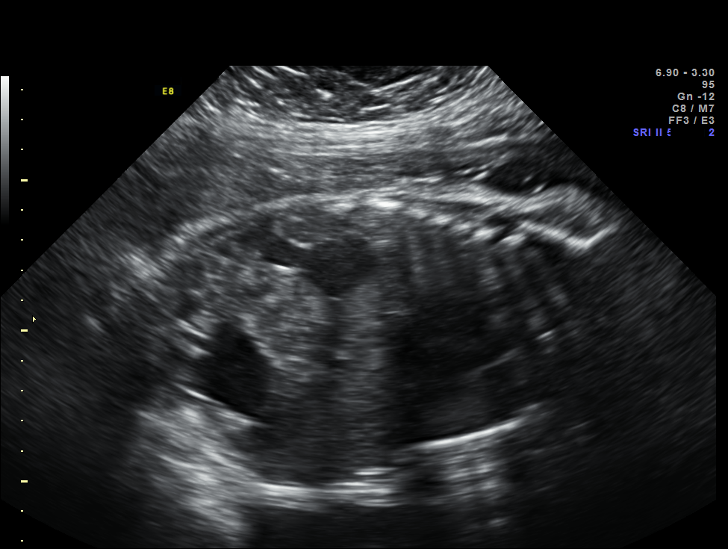

[13 of 14 positions shown; findings below may reference images not displayed]

OBSTETRICS REPORT
                      (Signed Final 04/19/2014 [DATE])

Service(s) Provided

 [HOSPITAL]                                         76815.0
Indications

 Polyhydramnios, third trimester, antepartum
 condition or complication, unspecified fetus
 Hypertension - Chronic/Pre-existing (no meds)
 Poor obstetric history: Previous preeclampsia /
 eclampsia/gestational HTN
 Obesity complicating pregnancy, third trimester
 36 weeks gestation of pregnancy
Fetal Evaluation

 Num Of Fetuses:    1
 Fetal Heart Rate:  148                          bpm
 Cardiac Activity:  Observed
 Presentation:      Cephalic
 Placenta:          Anterior, above cervical os

 Amniotic Fluid
 AFI FV:      Polyhydramnios
 AFI Sum:     25.72   cm       96  %Tile     Larg Pckt:    7.75  cm
 RUQ:   5.37    cm   RLQ:    6.49   cm    LUQ:   7.75    cm   LLQ:    6.11   cm
Gestational Age

 Best:          36w 4d     Det. By:  U/S    (01/07/14)        EDD:   05/13/14
Cervix Uterus Adnexa

 Cervix:       Not visualized (advanced GA >08wks)

 Left Ovary:    Previously seen.
 Right Ovary:   Previously seen
Impression

 Single IUP at 36w 4d
 Polyhydramnios, CHTN
 BPP [DATE]
 Mild polyhydramnios noted with an AFI of 25.7 cm
Recommendations

 Continue 2x weekly NSTs with weekly AFIs
 Ultrasound for growth over next 1-2 weeks
 Recommend delivery at 39 weeks in the absence of other
 complications

 questions or concerns.

## 2016-11-22 ENCOUNTER — Encounter (HOSPITAL_COMMUNITY): Payer: Self-pay | Admitting: Emergency Medicine

## 2016-11-22 ENCOUNTER — Emergency Department (HOSPITAL_COMMUNITY)
Admission: EM | Admit: 2016-11-22 | Discharge: 2016-11-23 | Disposition: A | Discharge status: Home / Self Care | Payer: BC Managed Care – PPO | Attending: Emergency Medicine | Admitting: Emergency Medicine

## 2016-11-22 DIAGNOSIS — Z79899 Other long term (current) drug therapy: Secondary | ICD-10-CM | POA: Insufficient documentation

## 2016-11-22 DIAGNOSIS — F419 Anxiety disorder, unspecified: Secondary | ICD-10-CM | POA: Diagnosis not present

## 2016-11-22 DIAGNOSIS — R002 Palpitations: Secondary | ICD-10-CM | POA: Diagnosis not present

## 2016-11-22 DIAGNOSIS — Z87891 Personal history of nicotine dependence: Secondary | ICD-10-CM | POA: Insufficient documentation

## 2016-11-22 DIAGNOSIS — Z7982 Long term (current) use of aspirin: Secondary | ICD-10-CM | POA: Diagnosis not present

## 2016-11-22 DIAGNOSIS — F410 Panic disorder [episodic paroxysmal anxiety] without agoraphobia: Secondary | ICD-10-CM | POA: Diagnosis not present

## 2016-11-22 DIAGNOSIS — I10 Essential (primary) hypertension: Secondary | ICD-10-CM | POA: Diagnosis not present

## 2016-11-22 LAB — BASIC METABOLIC PANEL
Anion gap: 11 (ref 5–15)
BUN: 7 mg/dL (ref 6–20)
CO2: 21 mmol/L — AB (ref 22–32)
CREATININE: 0.62 mg/dL (ref 0.44–1.00)
Calcium: 9.2 mg/dL (ref 8.9–10.3)
Chloride: 107 mmol/L (ref 101–111)
GFR calc Af Amer: >60 mL/min (ref >60)
GFR calc non Af Amer: >60 mL/min (ref >60)
Glucose, Bld: 111 mg/dL — ABNORMAL HIGH (ref 65–99)
Potassium: 4.1 mmol/L (ref 3.5–5.1)
SODIUM: 139 mmol/L (ref 135–145)

## 2016-11-22 LAB — I-STAT BETA HCG BLOOD, ED (MC, WL, AP ONLY): I-stat hCG, quantitative: <5 m[IU]/mL (ref <5)

## 2016-11-22 LAB — CBC
HCT: 41.1 % (ref 36.0–46.0)
Hemoglobin: 14 g/dL (ref 12.0–15.0)
MCH: 28.7 pg (ref 26.0–34.0)
MCHC: 34.1 g/dL (ref 30.0–36.0)
MCV: 84.2 fL (ref 78.0–100.0)
Platelets: 252 10*3/uL (ref 150–400)
RBC: 4.88 MIL/uL (ref 3.87–5.11)
RDW: 12.6 % (ref 11.5–15.5)
WBC: 12.1 10*3/uL — ABNORMAL HIGH (ref 4.0–10.5)

## 2016-11-22 LAB — TSH: TSH: 2.052 u[IU]/mL (ref 0.350–4.500)

## 2016-11-22 MED ORDER — LORAZEPAM 2 MG/ML IJ SOLN
1.0000 mg | Freq: Once | INTRAMUSCULAR | Status: AC
  Administered 2016-11-23: 1 mg via INTRAVENOUS
  Filled 2016-11-22: qty 1

## 2016-11-22 MED ORDER — SODIUM CHLORIDE 0.9 % IV BOLUS (SEPSIS)
1000.0000 mL | Freq: Once | INTRAVENOUS | Status: AC
  Administered 2016-11-23: 1000 mL via INTRAVENOUS

## 2016-11-22 NOTE — ED Provider Notes (Signed)
[facility]-EMERGENCY DEPT Provider Note   CSN: [id number] Arrival date & time: 11/22/16  [id number]     History   Chief Complaint Chief Complaint  Patient presents with  . Palpitations  . Anxiety    HPI Wendy Walter is a 31 y.o. female.  HPI   31 year old female with past medical history as below who presents with palpitations. The patient states that over the last 2 months, she has had progressively worsening anxiety. She has had multiple stressors at work as well as stress due to planning a vacation for which she is leaving in the next several days. She states that when she returned home from a work trip yesterday, she had a significant amount of things to do. She began to feel very overwhelmed. She then developed palpitations, tingling in her hands and feet, and a sensation that she could not catch her breath and that she needed to breathe fast. She became concerned that she had an underlying heart condition and subsequent presents for evaluation. Currently, after EKG, she does feel that she feels improved. She does admit to increased anxiety and neck she has established an appointment with a psychiatrist as an outpatient. She declines any previous medication uses. No alcohol or other drug use. No heavy caffeine use.    Past Medical History:  Diagnosis Date  . Hypertension     There are no active problems to display for this patient.   Past Surgical History:  Procedure Laterality Date  . WISDOM TOOTH EXTRACTION      OB History    Gravida Para Term Preterm AB Living   3 3 3     3    SAB TAB Ectopic Multiple Live Births         0 3       Home Medications    Prior to Admission medications   Medication Sig Start Date End Date Taking? Authorizing Provider  aspirin EC 81 MG tablet Take 81 mg by mouth every 6 (six) hours as needed (for headache).   Yes [provider]  levonorgestrel (MIRENA) 20 MCG/24HR IUD 1 each by Intrauterine route once.   Yes [provider]    hydrOXYzine (ATARAX/VISTARIL) 25 MG tablet Take 1 tablet (25 mg total) by mouth every 8 (eight) hours as needed for anxiety. 11/23/16   Shaune PollackIsaacs, Makailee Nudelman, MD    Family History Family History  Problem Relation Age of Onset  . Diabetes Father   . Hypertension Maternal Grandmother   . Hypertension Paternal Grandmother     Social History Social History  Substance Use Topics  . Smoking status: Former Smoker    Quit date: 10/16/2005  . Smokeless tobacco: Never Used  . Alcohol use No     Allergies   Patient has no known allergies.   Review of Systems Review of Systems  Constitutional: Negative for chills, fatigue and fever.  HENT: Negative for congestion and rhinorrhea.   Eyes: Negative for visual disturbance.  Respiratory: Negative for cough, shortness of breath and wheezing.   Cardiovascular: Positive for palpitations. Negative for chest pain and leg swelling.  Gastrointestinal: Negative for abdominal pain, diarrhea, nausea and vomiting.  Genitourinary: Negative for dysuria and flank pain.  Musculoskeletal: Negative for neck pain and neck stiffness.  Skin: Negative for rash and wound.  Allergic/Immunologic: Negative for immunocompromised state.  Neurological: Negative for syncope, weakness and headaches.  Psychiatric/Behavioral: The patient is nervous/anxious.   All other systems reviewed and are negative.    Physical Exam Updated Vital  Signs BP (!) 148/84 (BP Location: Right Arm)   Pulse 100   Temp 98.4 F (36.9 C) (Oral)   Resp 16   Ht 5\' 7"  (1.702 m)   Wt 129.3 kg (285 lb)   SpO2 100%   BMI 44.64 kg/m   Physical Exam  Constitutional: She is oriented to person, place, and time. She appears well-developed and well-nourished. No distress.  HENT:  Head: Normocephalic and atraumatic.  Eyes: Conjunctivae are normal.  Neck: Neck supple.  Cardiovascular: Regular rhythm and normal heart sounds.  Tachycardia present.  Exam reveals no friction rub.   No murmur  heard. Pulmonary/Chest: Effort normal and breath sounds normal. No respiratory distress. She has no wheezes. She has no rales.  Abdominal: She exhibits no distension.  Musculoskeletal: She exhibits no edema.  Neurological: She is alert and oriented to person, place, and time. She exhibits normal muscle tone.  Skin: Skin is warm. Capillary refill takes less than 2 seconds.  Psychiatric: Her mood appears anxious.  Nursing note and vitals reviewed.    ED Treatments / Results  Labs (all labs ordered are listed, but only abnormal results are displayed) Labs Reviewed  CBC - Abnormal; Notable for the following:       Result Value   WBC 12.1 (*)    All other components within normal limits  BASIC METABOLIC PANEL - Abnormal; Notable for the following:    CO2 21 (*)    Glucose, Bld 111 (*)    All other components within normal limits  TSH  I-STAT BETA HCG BLOOD, ED ([facility], [facility], [facility] ONLY)    EKG  EKG Interpretation  Date/Time:  Monday November 22 2016 18:42:14 EDT Ventricular Rate:  127 PR Interval:    QRS Duration: 92 QT Interval:  318 QTC Calculation: 463 R Axis:   72 Text Interpretation:  Sinus tachycardia Consider right atrial enlargement Borderline T abnormalities, anterior leads No old tracing to compare Confirmed by Shaune Pollack [phone]) on 11/22/2016 11:45:38 PM Also confirmed by Shaune Pollack [phone]), editor Elita Quick ([id number])  on 11/23/2016 9:17:08 AM       Radiology No results found.  Procedures Procedures (including critical care time)  Medications Ordered in ED Medications  sodium chloride 0.9 % bolus 1,000 mL (0 mLs Intravenous Stopped 11/23/16 0133)  LORazepam (ATIVAN) injection 1 mg (1 mg Intravenous Given 11/23/16 0012)     Initial Impression / Assessment and Plan / ED Course  I have reviewed the triage vital signs and the nursing notes.  Pertinent labs & imaging results that were available during my care of the patient were reviewed by me and considered in  my medical decision making (see chart for details).     31 yo F with PMHx as above here with tachycardia, palpitations, tingling in hands/feet, and extreme anxiety in setting of recent stressors. Suspect anxiety with panic attack/disorder. Pt sx resolved after ativan and reassurance. EKG without signs of WPW, long QT, or other arrhythmia or abnormality. Labs show possible mild dehydration - fluids given. No anemia. EKG nonischemic. No other s/s to suggest acute emergent pathology, and pt has known psych stressors. Will d/c on atarax, otupt psych follow-up  Final Clinical Impressions(s) / ED Diagnoses   Final diagnoses:  Panic attack  Anxiety    New Prescriptions Discharge Medication List as of 11/23/2016 12:09 AM    START taking these medications   Details  hydrOXYzine (ATARAX/VISTARIL) 25 MG tablet Take 1 tablet (25 mg total) by mouth every 8 (  eight) hours as needed for anxiety., Starting Tue 11/23/2016, Print         Shaune Pollack, MD 11/24/16 [id number]

## 2016-11-22 NOTE — ED Notes (Signed)
Pt from home with c/o palpitations that began last night. Pt states pain began last night. Pt states she has been under extra stress due to family trips and work stress. Pt was tachycardic until she was told her EKG was normal, then her heart rate went from 155 down to 108

## 2016-11-22 NOTE — ED Notes (Signed)
Writer states that RN would obtain blood work when starting IV.

## 2016-11-23 MED ORDER — HYDROXYZINE HCL 25 MG PO TABS: 25.0000 mg | ORAL_TABLET | Freq: Three times a day (TID) | ORAL | 0 refills | Status: AC | PRN
